# Patient Record
Sex: Female | Born: 1960 | Race: White | Hispanic: No | Smoking: Never smoker
Health system: Southern US, Community
[De-identification: ages and names within clinical notes are randomized; demographics above are authoritative.]

## PROBLEM LIST (undated history)

## (undated) DIAGNOSIS — E559 Vitamin D deficiency, unspecified: Secondary | ICD-10-CM

## (undated) DIAGNOSIS — R7303 Prediabetes: Secondary | ICD-10-CM

## (undated) DIAGNOSIS — E785 Hyperlipidemia, unspecified: Secondary | ICD-10-CM

## (undated) DIAGNOSIS — M1990 Unspecified osteoarthritis, unspecified site: Secondary | ICD-10-CM

## (undated) HISTORY — PX: BREAST REDUCTION SURGERY: SHX8

## (undated) HISTORY — DX: Unspecified osteoarthritis, unspecified site: M19.90

## (undated) HISTORY — PX: LAPAROSCOPY: SHX197

## (undated) HISTORY — DX: Prediabetes: R73.03

## (undated) HISTORY — DX: Hyperlipidemia, unspecified: E78.5

## (undated) HISTORY — PX: ABLATION: SHX5711

## (undated) HISTORY — DX: Vitamin D deficiency, unspecified: E55.9

---

## 2003-07-14 HISTORY — PX: REDUCTION MAMMAPLASTY: SUR839

## 2004-04-21 ENCOUNTER — Other Ambulatory Visit: Admission: RE | Admit: 2004-04-21 | Discharge: 2004-04-21 | Payer: Self-pay | Admitting: Obstetrics and Gynecology

## 2004-05-26 ENCOUNTER — Ambulatory Visit: Payer: Self-pay | Admitting: Cardiology

## 2004-07-22 ENCOUNTER — Ambulatory Visit (HOSPITAL_COMMUNITY): Admission: RE | Admit: 2004-07-22 | Discharge: 2004-07-22 | Payer: Self-pay | Admitting: Gastroenterology

## 2004-07-22 ENCOUNTER — Encounter (INDEPENDENT_AMBULATORY_CARE_PROVIDER_SITE_OTHER): Payer: Self-pay | Admitting: *Deleted

## 2004-10-01 ENCOUNTER — Ambulatory Visit: Payer: Self-pay | Admitting: Cardiology

## 2005-02-10 HISTORY — PX: SALPINGECTOMY: SHX328

## 2005-02-20 ENCOUNTER — Encounter (INDEPENDENT_AMBULATORY_CARE_PROVIDER_SITE_OTHER): Payer: Self-pay | Admitting: Specialist

## 2005-02-20 ENCOUNTER — Observation Stay (HOSPITAL_COMMUNITY): Admission: RE | Admit: 2005-02-20 | Discharge: 2005-02-21 | Payer: Self-pay | Admitting: Obstetrics and Gynecology

## 2005-02-23 ENCOUNTER — Ambulatory Visit: Payer: Self-pay | Admitting: Hematology & Oncology

## 2005-07-29 ENCOUNTER — Other Ambulatory Visit: Admission: RE | Admit: 2005-07-29 | Discharge: 2005-07-29 | Payer: Self-pay | Admitting: Obstetrics & Gynecology

## 2005-12-10 ENCOUNTER — Ambulatory Visit: Payer: Self-pay | Admitting: Cardiology

## 2006-01-18 ENCOUNTER — Ambulatory Visit: Payer: Self-pay | Admitting: Cardiology

## 2006-10-12 HISTORY — PX: PARTIAL KNEE ARTHROPLASTY: SHX2174

## 2006-11-05 ENCOUNTER — Other Ambulatory Visit: Admission: RE | Admit: 2006-11-05 | Discharge: 2006-11-05 | Payer: Self-pay | Admitting: Obstetrics & Gynecology

## 2006-11-10 ENCOUNTER — Encounter: Admission: RE | Admit: 2006-11-10 | Discharge: 2006-11-10 | Payer: Self-pay | Admitting: Obstetrics & Gynecology

## 2006-11-15 ENCOUNTER — Encounter: Admission: RE | Admit: 2006-11-15 | Discharge: 2006-11-15 | Payer: Self-pay | Admitting: Obstetrics & Gynecology

## 2007-03-02 ENCOUNTER — Ambulatory Visit: Payer: Self-pay | Admitting: Cardiology

## 2007-03-17 ENCOUNTER — Ambulatory Visit: Payer: Self-pay | Admitting: Cardiology

## 2007-03-17 LAB — CONVERTED CEMR LAB
ALT: 16 units/L (ref 0–35)
AST: 17 units/L (ref 0–37)
Albumin: 3.7 g/dL (ref 3.5–5.2)
Alkaline Phosphatase: 60 units/L (ref 39–117)
Bilirubin, Direct: 0.1 mg/dL (ref 0.0–0.3)
CRP, High Sensitivity: 4 (ref 0.00–5.00)
Direct LDL: 143 mg/dL
Total Bilirubin: 0.6 mg/dL (ref 0.3–1.2)
Total CHOL/HDL Ratio: 3.2
Total Protein: 6.7 g/dL (ref 6.0–8.3)
Triglycerides: 99 mg/dL (ref 0–149)
VLDL: 20 mg/dL (ref 0–40)

## 2007-12-15 ENCOUNTER — Encounter: Admission: RE | Admit: 2007-12-15 | Discharge: 2007-12-15 | Payer: Self-pay | Admitting: Obstetrics & Gynecology

## 2008-02-22 ENCOUNTER — Other Ambulatory Visit: Admission: RE | Admit: 2008-02-22 | Discharge: 2008-02-22 | Payer: Self-pay | Admitting: Obstetrics & Gynecology

## 2008-07-13 HISTORY — PX: REPLACEMENT TOTAL KNEE: SUR1224

## 2008-07-17 ENCOUNTER — Ambulatory Visit (HOSPITAL_COMMUNITY): Admission: RE | Admit: 2008-07-17 | Discharge: 2008-07-18 | Payer: Self-pay | Admitting: Orthopedic Surgery

## 2009-01-15 ENCOUNTER — Encounter: Admission: RE | Admit: 2009-01-15 | Discharge: 2009-01-15 | Payer: Self-pay | Admitting: Obstetrics & Gynecology

## 2009-08-09 ENCOUNTER — Ambulatory Visit: Payer: Self-pay | Admitting: Radiology

## 2009-08-09 ENCOUNTER — Emergency Department (HOSPITAL_BASED_OUTPATIENT_CLINIC_OR_DEPARTMENT_OTHER): Admission: EM | Admit: 2009-08-09 | Discharge: 2009-08-09 | Payer: Self-pay | Admitting: Emergency Medicine

## 2010-01-22 ENCOUNTER — Encounter: Admission: RE | Admit: 2010-01-22 | Discharge: 2010-01-22 | Payer: Self-pay | Admitting: Obstetrics & Gynecology

## 2010-08-03 ENCOUNTER — Encounter: Payer: Self-pay | Admitting: Obstetrics & Gynecology

## 2010-10-27 LAB — CBC
HCT: 33.4 % — ABNORMAL LOW (ref 36.0–46.0)
Hemoglobin: 11 g/dL — ABNORMAL LOW (ref 12.0–15.0)
RBC: 3.62 MIL/uL — ABNORMAL LOW (ref 3.87–5.11)
RDW: 14.1 % (ref 11.5–15.5)
WBC: 8 10*3/uL (ref 4.0–10.5)

## 2010-10-27 LAB — BASIC METABOLIC PANEL
BUN: 8 mg/dL (ref 6–23)
Calcium: 8 mg/dL — ABNORMAL LOW (ref 8.4–10.5)
GFR calc non Af Amer: >60 mL/min (ref >60)
Glucose, Bld: 86 mg/dL (ref 70–99)

## 2010-11-25 NOTE — Consult Note (Signed)
NAMEJESSILYNN, [name]              ACCOUNT NO.:  [id number]   MEDICAL RECORD NO.:  [id number]          PATIENT TYPE:  INP   LOCATION:                               FACILITY:  [facility]   PHYSICIAN:  Madlyn Frankel. Charlann Boxer, M.D.  DATE OF BIRTH:  08/14/1960   DATE OF CONSULTATION:  DATE OF DISCHARGE:                                 CONSULTATION   PROCEDURE:  Left partial knee replacement.   CHIEF COMPLAINT:  Left knee pain.   HISTORY OF PRESENT ILLNESS:  A 50 year old female with a history of left  medial knee pain secondary to medial compartment osteoarthritis.  It has  been refractory to all conservative treatment including oral anti-  inflammatories, cortisone injection and viscous supplementation.   PAST MEDICAL HISTORY:  Significant for osteoarthritis.   PAST SURGICAL HISTORY:  1. C-section x2.  2. Breast reduction.  3. Endometrial ablation.   FAMILY HISTORY:  Stroke, cardiovascular disease.   SOCIAL HISTORY:  Married.  Not a smoker.  Primary caregiver will be her  husband in the home postoperatively.   MEDICATION ALLERGIES:  CODEINE.   MEDICATIONS:  Wellbutrin 300 mg p.o. daily.   REVIEW OF SYSTEMS:  See HPI.   PHYSICAL EXAMINATION:  VITAL SIGNS:  Pulse 72, respirations 16, blood  pressure 130/78.  GENERAL:  Awake, alert and oriented.  NECK:  Supple.  No carotid bruits.  CHEST:  Lungs clear to auscultation bilaterally.  BREASTS:  Deferred.  HEART:  Regular rate and rhythm.  S1, S2 distinct.  ABDOMEN:  Soft, nondistended.  Bowel sounds present.  PELVIS:  Stable.  GU:  Deferred.  EXTREMITIES:  Left knee has the medial side pain.  SKIN:  No cellulitis.  NEUROLOGICAL:  Intact distal sensibilities.   LABORATORY DATA:  Labs, EKG, chest x-ray all pending presurgical  testing.   IMPRESSION:  Left knee medial compartment osteoarthritis.   PLAN OF ACTION:  Left unicondylar knee replacement of the medial  compartment by Dr. Madlyn Frankel. Charlann Boxer at [facility] on July 17, 2008.  Risks and complications were discussed.   Postoperative medications were provided and they included aspirin,  Robaxin, iron, Colace, Ambien.  She will be utilizing _______ for pain  medication as she has severe codeine allergy.     ______________________________  Yetta Glassman [address], [place]      Madlyn Frankel. Charlann Boxer, M.D.  Electronically Signed    BLM/MEDQ  D:  07/12/2008  T:  07/12/2008  Job:  [id number]   cc:   M. Leda Quail, MD  Fax: [phone]   Tammy R. Collins [place], M.D.  Fax: [phone]

## 2010-11-25 NOTE — Op Note (Signed)
[name], [name]              ACCOUNT NO.:  [id number]   MEDICAL RECORD NO.:  [id number]          PATIENT TYPE:  INP   LOCATION:  0008                         FACILITY:  [facility]   PHYSICIAN:  Madlyn Frankel. Charlann Boxer, M.D.  DATE OF BIRTH:  [birth year]   DATE OF PROCEDURE:  07/17/2008  DATE OF DISCHARGE:                               OPERATIVE REPORT   PREOPERATIVE DIAGNOSIS:  Left medial compartment osteoarthritis.   POSTOPERATIVE DIAGNOSIS:  Left medial compartment osteoarthritis.   PROCEDURE:  Left partial knee replacement.   COMPONENTS USED:  Biomet Oxford unicompartmental knee replacement, size  small femur with size a medial left tibia and a 4-mm insert  corresponding to the size small femur.   SURGEON:  Madlyn Frankel. Charlann Boxer, M.D.   ASSISTANT:  Dwyane Luo, P. A.   ANESTHESIA:  Dermal spinal.   DRAINS:  One Hemovac.   COMPLICATIONS:  None.   FINDINGS:  None.   TOURNIQUET TIME:  55 at 250 mmHg.   INDICATIONS FOR THE PROCEDURE:  Kanchan is a 50 year old patient of mine  who has had previous arthroscopic surgery and meniscal pathology.  She  had progressive arthritic changes in the postoperative period.  She had  failed bracing, medications and injection.  We reviewed options at 50  years of age and she was a good candidate for partial knee replacement  based on the fact that her pain was predominantly medial.  We reviewed  the risks and benefits of this type procedure with the benefits  obviously being pain relief and resuming activities without need of  medication with complications including infection, DVT, failure,  progression of arthritis and need for revision to total knee  replacement.  Consent was obtained for the benefit of pain relief.   PROCEDURE IN DETAIL:  The patient was brought to the operative theater.  Once adequate anesthesia, preoperative antibiotics and Ancef  administered, the patient was positioned supine and a thigh tourniquet  was placed.  The left lower  extremity was pre-scrubbed and prepped and  draped in a sterile fashion.  A time-out was performed to identify the  patient and extremity.  The leg was exsanguinated and tourniquet  elevated to 150 mmHg.  A paramidline incision was made from the patella  to the tubercle.  Sharp dissection was carried down to the extensor  mechanism identifying and creating soft tissue planes.  Arthrotomy was  created medially.  A large effusion was noted and drained.  After  initial synovectomy and debridement in this area and placement of  retractors, I removed osteophytes along the entire medial aspect of the  distal femur as well as the anterior.  I also removed a small notch of  osteophytes from the lateral aspect of the medial femoral condyle.  With  this exposure obtained at this point, the extramedullary guide was  placed on the proximal tibia and a perpendicular cut was made.  I  initially pinned it into position with a reciprocating saw used first  and then an oscillating saw. I checked the cut surface and felt that a  size 8 tibia fit best.  With the 8 tibia in place, I found it was  difficult to get the size 4 feeler gauge in place.  I  thus replaced the  extramedullary guide and recut the tibia 3 more mm.   With this, I was able to get the 4-mm feeler gauge in and did not need  to move up to a size 5.   At this point,the femoral canal was opened with a drill and increased in  size by the starting awl.  I then placed an intramedullary rod.   A size 3 feeler gauge was placed followed by the guide for the posterior  cutting block.  Once it was positioned in the middle portion of the  medial femoral condyle as well as parallel to the rod and lateral  planes, the drill holes were made.   This was exchanged for the posterior cutting block.  The oscillating saw  was used to make a posterior cut.   At this point, I was able to maneuver the meniscus.  The remaining bone  were all inspected in the  posterior medial aspect of the knee. Once this  was done, I checked with a 8 tibial tray and the small femur and was  able to feel the gauge in place; however, I was unable to get even one  in comfortably in 20 degrees of flexion.  For this reason, I chose to  use a size 5 feeler gauge and a size 5 spigot was placed and the distal  femur was milled.   I removed remaining bone and we trialed.  At this point. I was happy  with a size 4, both in flexion and extension.  We removed the trial  components.  The tibial tray was held in position and pinned and I used  the reciprocating saw to create the trough for the keel.  I then removed  remaining bone using the keeled osteotome as well as to the pterion  rongeur.  A trial reduction was carried out with the keeled 8 medial  left tibia and small femur.  I used the lollipop inserts to identify;  the ligaments appeared to be stable from extension to flexion.  There  was no evidence of impingement.   Given all these parameters, I removed the trial components. I drilled  sclerotic bone with a drill  available in the set.  We then irrigated  the injected the knee and injected the synovial capsule junction with a  course of Marcaine with epinephrine and 1 mL of Toradol.   Cement was mixed.  Final components were opened.  Once I irrigated the  knee and removed debris and trialed this cut surface,  the tibial  component was cemented in first.  Compression was applied to the trial  femoral component in 45 degrees of flexion.  Extruded cement was  removed.  After 2 minutes, the femoral component was cemented in  position.  Extruded cement was removed and the knee brought to 45  degrees of flexion and again held with the 4 feeler gauge to apply  compression.  Once cement cured, excess cement was removed throughout  the knee.  Once I was satisfied, I was unable to visualize any remaining  cement.  We retrialed the lollipop size 4 insert.  The knee  again  appeared to be very well-balanced.  I used an osteotome to remove some  the anterior bone off the mid surface of the distal femur.   The final 4 insert  was opened and it popped into position with good  tension.   At this point, we reirrigated the knee and the tourniquet was let down  at 55 minutes.  A medium Hemovac drain was placed deep.  With the knee  in flexion, I reapproximated the extensor mechanism using #1 Vicryl.  The remainder of  the wound was closed with 2-0 Vicryl and running 4-0 Monocryl.  The knee  was cleaned, dried and dressed sterilely with Steri-Strips and a bulky  wrap.  She was brought to recovery room in stable condition tolerating  the procedure well.      Madlyn Frankel Charlann Boxer, M.D.  Electronically Signed     MDO/MEDQ  D:  07/17/2008  T:  07/17/2008  Job:  [id number]

## 2010-11-28 NOTE — Op Note (Signed)
[name], [name]              ACCOUNT NO.:  [id number]   MEDICAL RECORD NO.:  [id number]          PATIENT TYPE:  OBV   LOCATION:  9305                          FACILITY:  [facility]   PHYSICIAN:  Michelle L. Grewal, M.D.DATE OF BIRTH:  20-Aug-1960   DATE OF PROCEDURE:  02/20/2005  DATE OF DISCHARGE:                                 OPERATIVE REPORT   PREOPERATIVE DIAGNOSES:  1.  Pelvic pain.  2.  Menometorrhagia.  3.  Anemia.  4.  Adnexal mass.   POSTOPERATIVE DIAGNOSES:  1.  Left tubal endometriosis.  2.  Tubal torsion.  3.  Menometorrhagia.   PROCEDURE:  1.  Diagnostic laparoscopy.  2.  Lysis of adhesions.  3.  Left salpingectomy.  4.  Right tubal ligation.  5.  D&C.  6.  Hysteroscopy.  7.  ThermaChoice endometrial ablation.   SURGEON:  Michelle L. Vincente Poli, M.D.   ANESTHESIA:  General.   SPECIMENS:  Uterine curetting's and left tube.   ESTIMATED BLOOD LOSS:  300 mL.   COMPLICATIONS:  None.   DESCRIPTION OF PROCEDURE:  The patient taken to the operating room, she was  intubated without difficulty. She was prepped and draped in the usual  sterile fashion. An in and out catheter was used to empty the bladder. A  uterine manipulator was inserted and attention was turned to the abdomen. An  infraumbilical incision was made and the Veress needle is inserted one time.  Pneumoperitoneum is achieved. The 11 mm trocar is inserted and the patient  placed in Trendelenburg position. I then placed two accessory ports, an 11  mm suprapubic and 11 mm in the right lower quadrant under direct  visualization. We then visualized the upper abdomen which appeared to be  normal. The uterus appeared to be normal on the exterior surface. The right  tube and ovary were within normal limits. The pathology was on the left side  of the pelvis where there was endometriosis involving the left pelvic  sidewall and the left fallopian tube was torsed many times with a large  endometrioma at the very  end of it which was fixed to the cul-de-sac. I then  did some blunt and sharp dissection lysing adhesions, freed up the ovary  pretty good on the left side, lifted up the tube and transected it using the  gyrus. I then went over with Kleppinger's and did a right tubal ligation as  the patient desired permanent sterilization. At this point, the patient is  sterile. After I resected the tube, I then switched to a 5 mm scope, put the  Endobag through the infraumbilical port, put the mass through the Endobag  and removed it completely. Irrigation of the pelvis was performed, excellent  hemostasis was again noted. We then released the pneumoperitoneum, removed  all trocars and closed each incision with Dermabond. At this point, I turned  vaginally where a speculum was inserted into the vagina, the cervix was  grasped with a tenaculum, cervical internal os was gently dilated using  Pratt dilators, diagnostic hysteroscope was inserted through the uterine  cavity. There was a copious  amount of clot in there and with irrigation and  with sharp curette, I removed all tissue in the uterus. I think that had  distended her cavity quite a bit. At that point, we then placed the  ThermaChoice three balloon inside and performed an endometrial ablation  according to [facility] specifications. During this procedure, she was  noted to be oozing quite a bit until the very end of the procedure she  stopped bleeding and then was given Methergine. She was observed during this  time and noted to have minimal bleeding. All instruments were removed from  the vagina, all sponge, lap and instrument counts were correct x2. The  patient went to the recovery room in stable condition. Because of the  patient's low hemoglobin, I recommended for her to stay overnight for  observation.       MLG/MEDQ  D:  02/21/2005  T:  02/21/2005  Job:  [id number]

## 2010-11-28 NOTE — Discharge Summary (Signed)
[name], [name]              ACCOUNT NO.:  [id number]   MEDICAL RECORD NO.:  [id number]          PATIENT TYPE:  OIB   LOCATION:  1611                         [facility]ACILITY:  [facility]   PHYSICIAN:  Madlyn Frankel. Charlann Boxer, M.D.  DATE OF BIRTH:  1961/04/06   DATE OF ADMISSION:  07/17/2008  DATE OF DISCHARGE:  07/18/2008                               DISCHARGE SUMMARY   ADMITTING DIAGNOSIS:  Osteoarthritis.   DISCHARGE DIAGNOSIS:  Osteoarthritis.   HISTORY OF PRESENT ILLNESS:  A 50 year old female with a history of left  medial knee pain secondary to medial compartment osteoarthritis.  Refractory to all conservative treatment.   PROCEDURE:  Left partial knee replacement, medial compartment.  Surgeon  Dr. Durene Romans.  Assistant Dwyane Luo PA-C.   CONSULTATION:  None.   LABORATORY DATA:  CBC final reading showed her white blood cell count of  8, hemoglobin 11, hematocrit 33.4, platelets 239.  Coags normal.  White  cell differential within normal limits.  Metabolic panel sodium 140,  potassium 3.8, glucose 86, creatinine 0.61.  Calcium 8.  UA did show a  hemoglobin rare bacteria, negative for nitrites.  Cardiology EKG shows sinus rhythm.   HOSPITAL COURSE:  The patient underwent a left partial knee replacement  of medial compartment.  Stay was unremarkable.  She made great progress  with physical therapy.  She was hemodynamically stable.  Dressing  changes demonstrated to the patient.  Home health care with PT was  arranged.   DISCHARGE DISPOSITION:  Discharged home with home health care PT, stable  and improved condition.   DISCHARGE PHYSICAL THERAPY:  Weightbearing as tolerated with rolling  walker.   DISCHARGE DIET:  Heart-healthy.   DISCHARGE WOUND CARE:  Keep dry.   DISCHARGE MEDICATIONS:  1. Aspirin 325 mg p.o. b.i.d. times 6 weeks.  2. Robaxin 500 mg p.o. q.6.  3. Iron 325 mg p.o. t.i.d.  4. Colace 100 mg p.o. b.i.d.  5. MiraLax 17 grams p.o. daily.  6. Celebrex 200 mg p.o.  b.i.d.  7. Wellbutrin 300 mg 1 p.o. q.a.m.  8. Nucynta 50 mg 1-2 p.o. q.4-6 p.r.n. pain.   DISCHARGE FOLLOWUP:  Follow with Dr. Charlann Boxer at phone number [phone] in 2  weeks for wound check.     ______________________________  [name], [place]      Madlyn Frankel. Charlann Boxer, M.D.  Electronically Signed    BLM/MEDQ  D:  08/07/2008  T:  08/07/2008  Job:  [id number]   cc:   M. Leda Quail, MD  Fax: [phone]   Tammy R. Collins [place], M.D.  Fax: [phone]

## 2010-11-28 NOTE — Letter (Signed)
October 13, 2007    To Whom It May Concern:   RE:  JERMIYA, REICHL  MRN:  [id number]  /  DOB:  Jan 10, 1961   I have been informed of the fact that insurance will not pay for two  [employer] lipoprotein profiles that were done on Ms. [name].  Because of such discrepant results between two different panels, I  ordered an additional study.  I was concerned that one of these studies  might have a been lab error.   Because of a strong family history of vascular disease, a high level of  lipoprotein A, it was very important to clarify this as much as  possible.  Treatment with a statin and/or other medication depended on  these results.  I also discussed this with Dr. Hulda Marin at [facility], a lipid specialist, and he agreed that this  needed to be rectified.   Hence, we felt this was medically necessary for determining treatment  for this patient.  Thus, we also feel that it should be paid for by her  insurance.    Sincerely,      Jesse Sans. Daleen Squibb, MD, [facility]  Electronically Signed    TCW/MedQ  DD: 10/13/2007  DT: 10/14/2007  Job #: [id number]   CC:    [name]

## 2010-11-28 NOTE — Assessment & Plan Note (Signed)
[facility]                            CARDIOLOGY OFFICE NOTE   [name], [name]                     MRN:          [id number]  DATE:03/31/2007                            DOB:          [birth year]    [name] returns today for further management of her hyperlipidemia  and elevated LP(a).   She has a markedly positive family history of ischemic heart disease  with her mother, who also has an elevated LP(a).   We started her initially on Vytorin 10/20.  We had an excellent response  as noted in the chart.   We repeated lipids in August, which showed a regression in her values.  We were concerned that she was not taking the drug.  We repeated a  standard profile, which confirmed the fact that her lipids had increased  to baseline.  I brought her in today for conversation.   She tells me now that she had stopped the drug when her pharmaceutical  plan messed her up, and she had read all the mess about Vytorin.   It also should be noted that her C reactor protein was high as well.   She is totally asymptomatic at the present time.  She is recovering from  left knee arthroscopy, but is getting back in to tennis.  She only takes  Wellbutrin 150 daily.   I have had a conversation with her today, and showed her her values.  I  made a copy of her most recent values.  Since she has concern about  Vytorin, and the goal is to get her LDL well below 100, we will change  her to Lipitor 20 mg p.o. daily.  We will put her in for standard lipid  panel and LFTs in 6 weeks.  I am going to place a call to Dr. Hulda Marin at [facility] to see if there is anything else he would  offer.  I will plan on seeing her back again in 6 months.     Thomas C. Daleen Squibb, MD, [facility]  Electronically Signed    TCW/MedQ  DD: 03/31/2007  DT: 03/31/2007  Job #: [id number]   cc:   [name], M.D.

## 2010-12-30 ENCOUNTER — Other Ambulatory Visit: Payer: Self-pay | Admitting: Obstetrics & Gynecology

## 2010-12-30 DIAGNOSIS — Z1231 Encounter for screening mammogram for malignant neoplasm of breast: Secondary | ICD-10-CM

## 2011-01-26 ENCOUNTER — Ambulatory Visit
Admission: RE | Admit: 2011-01-26 | Discharge: 2011-01-26 | Disposition: A | Discharge status: Home / Self Care | Payer: Commercial Managed Care - PPO | Source: Ambulatory Visit | Attending: Obstetrics & Gynecology | Admitting: Obstetrics & Gynecology

## 2011-01-26 DIAGNOSIS — Z1231 Encounter for screening mammogram for malignant neoplasm of breast: Secondary | ICD-10-CM

## 2011-02-17 ENCOUNTER — Telehealth: Payer: Self-pay | Admitting: Cardiovascular Disease

## 2011-02-17 NOTE — Telephone Encounter (Signed)
Last Lipid panel was in 2008. Left a message informing patient to fast the morning she sees Dr. Clifton James and we could recheck fasting labs.

## 2011-02-17 NOTE — Telephone Encounter (Signed)
Per pt call, pt set up an appt w/ [facility] for Aug 29. Pt last saw Dr. Daleen Squibb in 2009. Pt wants to know if she needs to have blood work done prior to pt appt. Please return pt call to advise/disucss.

## 2011-03-11 ENCOUNTER — Encounter: Payer: Commercial Managed Care - PPO | Admitting: Cardiovascular Disease

## 2011-04-03 ENCOUNTER — Encounter: Payer: Self-pay | Admitting: Cardiovascular Disease

## 2011-04-06 ENCOUNTER — Encounter: Payer: Self-pay | Admitting: Cardiovascular Disease

## 2011-04-06 ENCOUNTER — Ambulatory Visit (INDEPENDENT_AMBULATORY_CARE_PROVIDER_SITE_OTHER): Payer: Commercial Managed Care - PPO | Admitting: Cardiovascular Disease

## 2011-04-06 VITALS — BP 140/80 | HR 60 | Resp 18 | Ht 66.0 in | Wt 198.8 lb

## 2011-04-06 DIAGNOSIS — E785 Hyperlipidemia, unspecified: Secondary | ICD-10-CM | POA: Insufficient documentation

## 2011-04-06 LAB — LIPID PANEL
HDL: 70.9 mg/dL (ref >39.00)
Total CHOL/HDL Ratio: 3
Triglycerides: 92 mg/dL (ref 0.0–149.0)
VLDL: 18.4 mg/dL (ref 0.0–40.0)

## 2011-04-06 LAB — LDL CHOLESTEROL, DIRECT: Direct LDL: 148 mg/dL

## 2011-04-06 NOTE — Progress Notes (Signed)
History of Present Illness:50 yo female with h/o hyperlipidemia (Lp (a)) here today as a new pt. She had been seen remotely by Dr. Juanito Doom in September 2008. She is here today to re-establish cardiology care. She has a strong family history of CAD. She tells me that she feels well. No chest pain, SOB, palpitations. She feels well overall.    Past Medical History  Diagnosis Date  . Hyperlipidemia   . Osteoarthritis     Past Surgical History  Procedure Date  . Partial knee arthroplasty     No current outpatient prescriptions on file.    Allergies  Allergen Reactions  . Codeine     History   Social History  . Marital Status: Married    Spouse Name: N/A    Number of Children: N/A  . Years of Education: N/A   Occupational History  . Not on file.   Social History Main Topics  . Smoking status: Not on file  . Smokeless tobacco: Not on file  . Alcohol Use: Not on file  . Drug Use: Not on file  . Sexually Active: Not on file   Other Topics Concern  . Not on file   Social History Narrative  . No narrative on file    Family History  Problem Relation Age of Onset  . Heart disease      Review of Systems:  As stated in the HPI and otherwise negative.   BP 140/80  Pulse 60  Resp 18  Ht 5\' 6"  (1.676 m)  Wt 198 lb 12.8 oz (90.175 kg)  BMI 32.09 kg/m2  Physical Examination: General: Well developed, well nourished, NAD HEENT: OP clear, mucus membranes moist SKIN: warm, dry. No rashes. Neuro: No focal deficits Musculoskeletal: Muscle strength 5/5 all ext Psychiatric: Mood and affect normal Neck: No JVD, no carotid bruits, no thyromegaly, no lymphadenopathy. Lungs:Clear bilaterally, no wheezes, rhonci, crackles Cardiovascular: Regular rate and rhythm. No murmurs, gallops or rubs. Abdomen:Soft. Bowel sounds present. Non-tender.  Extremities: No lower extremity edema. Pulses are 2 + in the bilateral DP/PT.  EKG:NSR, rate 60 bpm. Normal EKG.

## 2011-04-06 NOTE — Patient Instructions (Signed)
Your physician wants you to follow-up in:  12 months.  You will receive a reminder letter in the mail two months in advance. If you don't receive a letter, please call our office to schedule the follow-up appointment.   

## 2011-04-06 NOTE — Assessment & Plan Note (Signed)
Will check lipids. Call with results.

## 2011-04-10 ENCOUNTER — Telehealth: Payer: Self-pay | Admitting: Cardiovascular Disease

## 2011-04-10 NOTE — Telephone Encounter (Signed)
Pt returning you call

## 2011-04-10 NOTE — Telephone Encounter (Signed)
Patient return Dr. Gibson Ramp call regarding lab work results and starting Crestor 10 mg one tablet po at bedtime. Patient would like samples to see if she can tolerate medication A 14/20 mg sample pills placed at the front desk. She is aware to cut pill in 1/2 and take 1/2 tablet at bedtime.  Patient will call back to let MD  know whether she tolerate medication, and send a  prescription to the pharmacy . Also she will call  to schedule the 12 weeks lipid LFT blood work, because she would like to try the medication first and see if can tolerate it.

## 2011-04-17 LAB — BASIC METABOLIC PANEL
Calcium: 8.9 mg/dL (ref 8.4–10.5)
Chloride: 103 mEq/L (ref 96–112)
GFR calc Af Amer: >60 mL/min (ref >60)
Potassium: 3.7 mEq/L (ref 3.5–5.1)
Sodium: 139 mEq/L (ref 135–145)

## 2011-04-17 LAB — CBC
HCT: 41 % (ref 36.0–46.0)
Hemoglobin: 13.6 g/dL (ref 12.0–15.0)
MCV: 91.8 fL (ref 78.0–100.0)
Platelets: 269 10*3/uL (ref 150–400)

## 2011-04-17 LAB — URINALYSIS, ROUTINE W REFLEX MICROSCOPIC
Glucose, UA: NEGATIVE mg/dL
Ketones, ur: NEGATIVE mg/dL
Nitrite: NEGATIVE
Specific Gravity, Urine: 1.025 (ref 1.005–1.030)
pH: 6 (ref 5.0–8.0)

## 2011-04-17 LAB — URINE MICROSCOPIC-ADD ON

## 2011-04-17 LAB — DIFFERENTIAL
Eosinophils Absolute: 0.1 10*3/uL (ref 0.0–0.7)
Lymphocytes Relative: 22 % (ref 12–46)
Lymphs Abs: 1.1 10*3/uL (ref 0.7–4.0)
Monocytes Absolute: 0.4 10*3/uL (ref 0.1–1.0)
Monocytes Relative: 7 % (ref 3–12)
Neutro Abs: 3.7 10*3/uL (ref 1.7–7.7)
Neutrophils Relative %: 69 % (ref 43–77)

## 2011-04-17 LAB — PROTIME-INR: INR: 0.9 (ref 0.00–1.49)

## 2011-12-31 ENCOUNTER — Other Ambulatory Visit: Payer: Self-pay | Admitting: Obstetrics & Gynecology

## 2011-12-31 DIAGNOSIS — Z1231 Encounter for screening mammogram for malignant neoplasm of breast: Secondary | ICD-10-CM

## 2012-02-02 ENCOUNTER — Ambulatory Visit: Payer: Commercial Managed Care - PPO

## 2012-09-22 ENCOUNTER — Ambulatory Visit
Admission: RE | Admit: 2012-09-22 | Discharge: 2012-09-22 | Disposition: A | Discharge status: Home / Self Care | Payer: BC Managed Care – PPO | Source: Ambulatory Visit | Attending: Obstetrics & Gynecology | Admitting: Obstetrics & Gynecology

## 2012-09-22 DIAGNOSIS — Z1231 Encounter for screening mammogram for malignant neoplasm of breast: Secondary | ICD-10-CM

## 2013-01-05 ENCOUNTER — Encounter: Payer: Self-pay | Admitting: Certified Nurse Midwife

## 2013-01-10 ENCOUNTER — Ambulatory Visit (INDEPENDENT_AMBULATORY_CARE_PROVIDER_SITE_OTHER): Payer: BC Managed Care – PPO | Admitting: Certified Nurse Midwife

## 2013-01-10 ENCOUNTER — Encounter: Payer: Self-pay | Admitting: Certified Nurse Midwife

## 2013-01-10 VITALS — BP 108/62 | HR 64 | Resp 16 | Ht 65.75 in | Wt 202.0 lb

## 2013-01-10 DIAGNOSIS — F419 Anxiety disorder, unspecified: Secondary | ICD-10-CM

## 2013-01-10 DIAGNOSIS — N951 Menopausal and female climacteric states: Secondary | ICD-10-CM

## 2013-01-10 DIAGNOSIS — Z01419 Encounter for gynecological examination (general) (routine) without abnormal findings: Secondary | ICD-10-CM

## 2013-01-10 DIAGNOSIS — F411 Generalized anxiety disorder: Secondary | ICD-10-CM

## 2013-01-10 MED ORDER — ESCITALOPRAM OXALATE 10 MG PO TABS: 10.0000 mg | ORAL_TABLET | Freq: Every day | ORAL | Status: DC

## 2013-01-10 NOTE — Progress Notes (Signed)
52 y.o. G2P2002 Married Caucasian Fe here for annual exam. Periods normal, light until 12/13 then no menses until 6/14 with light  4 day duration. Having hot flashes, no problems. No vaginal dryness. Working hard with work, getting daughter who is OCD ready for college. Very anxious to the point of insomnia. Sees PCP prn. Had labs at work all normal per patient. Patient has been on medication for anxiety before and feels she is at the point she is beginning to feel out of control.  No thought of self harm or others.  Spouse supportive, but not helpful with family issues. Has counselor currently seeing on prn basis. No other health issues today.  Patient's last menstrual period was 12/15/2012.          Sexually active: no  The current method of family planning is abstinence.    Exercising: yes  treadmill, pump classes & walking Smoker:  no  Health Maintenance: Pap:  01-07-12 neg HPV HR neg MMG:  09-22-12 normal Colonoscopy:  2006 BMD:   none TDaP:  2006 Labs: none Self breast exam: not done   reports that she has never smoked. She does not have any smokeless tobacco history on file. She reports that she drinks about 1.0 ounces of alcohol per week. She reports that she does not use illicit drugs.  Past Medical History  Diagnosis Date  . Hyperlipidemia   . Osteoarthritis     Past Surgical History  Procedure Laterality Date  . Partial knee arthroplasty Left 4/08  . Laparoscopy    . Salpingectomy Left 8/06    secondary to cyst  . Ablation    . Ablation    . Breast reduction surgery    . Replacement total knee Left 1/10    Current Outpatient Prescriptions  Medication Sig Dispense Refill  . Acetaminophen (TYLENOL PO) Take by mouth as needed.      . Ibuprofen (ADVIL PO) Take by mouth as needed.      . Multiple Vitamins-Minerals (MULTIVITAMIN PO) Take by mouth as needed.       No current facility-administered medications for this visit.    Family History  Problem Relation Age of  Onset  . Heart disease    . Heart attack Mother   . Hypertension Mother   . Diabetes Sister     ROS:  Pertinent items are noted in HPI.  Otherwise, a comprehensive ROS was negative.  Exam:   BP 108/62  Pulse 64  Resp 16  Ht 5' 5.75" (1.67 m)  Wt 202 lb (91.627 kg)  BMI 32.85 kg/m2  LMP 12/15/2012 Height: 5' 5.75" (167 cm)  Ht Readings from Last 3 Encounters:  01/10/13 5' 5.75" (1.67 m)  04/06/11 5\' 6"  (1.676 m)    General appearance: alert, cooperative and appears stated age Head: Normocephalic, without obvious abnormality, atraumatic Neck: no adenopathy, supple, symmetrical, trachea midline and thyroid normal to inspection and palpation Lungs: clear to auscultation bilaterally Breasts: normal appearance, no masses or tenderness, No nipple retraction or dimpling, No nipple discharge or bleeding, No axillary or supraclavicular adenopathy Heart: regular rate and rhythm Abdomen: soft, non-tender; no masses,  no organomegaly Extremities: extremities normal, atraumatic, no cyanosis or edema Skin: Skin color, texture, turgor normal. No rashes or lesions Lymph nodes: Cervical, supraclavicular, and axillary nodes normal. No abnormal inguinal nodes palpated Neurologic: Grossly normal   Pelvic: External genitalia:  no lesions              Urethra:  normal appearing  urethra with no masses, tenderness or lesions              Bartholin's and Skene's: normal                 Vagina: normal appearing vagina with normal color and discharge, no lesions              Cervix: normal, non tender              Pap taken: no Bimanual Exam:  Uterus:  normal size, contour, position, consistency, mobility, non-tender and anteflexed              Adnexa: normal adnexa and no mass, fullness, tenderness               Rectovaginal: Confirms               Anus:  normal sphincter tone, no lesions  A:  Well Woman with normal exam  Perimenopausal with cycle change( no menses for 5 months then recent  normal period)  History of anxiety, feels medication needed again  Social stress with daughter with OCD   P:  Reviewed health and wellness pertinent to exam  Discussed perimenopausal changes,period expectations and also emotional changes. Also discussed thyroid effect on cycles. Given menses calendar with normal and abnormal parameters. Instructed to call if no menses in 3 months Lab: FSH,TSH  Discussed risks and benefits of anxiety medication, expectations and need for follow up with counselor and provider here for evaluation of medication effects. Patient agreeable to plan. Instructed to seek ER or 911 if thought of self harm or others. Encouraged to seek spouse support with family issues. Questions addressed RX Lexapro see order    Pap smear as per guidelines   Mammogram yearly pap smear not taken today  counseled on breast self exam, mammography screening, use and side effects of HRT, adequate intake of calcium and vitamin D, diet and exercise  Recheck medication use in 2 weeks   An After Visit Summary was printed and given to the patient.

## 2013-01-10 NOTE — Patient Instructions (Addendum)

## 2013-01-10 NOTE — Progress Notes (Signed)
Reviewed CPRomine, MD 

## 2013-01-11 ENCOUNTER — Telehealth: Payer: Self-pay

## 2013-01-11 NOTE — Telephone Encounter (Signed)
Message copied by Eliezer Bottom on Wed Jan 11, 2013 10:22 AM ------      Message from: Verner Chol      Created: Wed Jan 11, 2013  8:02 AM       Notify [facility] menopausal range, may or may not have bleeding, if no menses in 3 months as discussed needs to call      TSH normal ------

## 2013-01-11 NOTE — Telephone Encounter (Signed)
lmtcb

## 2013-01-11 NOTE — Telephone Encounter (Signed)
Patient notified of results.

## 2013-01-24 ENCOUNTER — Ambulatory Visit: Payer: BC Managed Care – PPO | Admitting: Certified Nurse Midwife

## 2013-01-25 ENCOUNTER — Ambulatory Visit: Payer: BC Managed Care – PPO | Admitting: Certified Nurse Midwife

## 2013-01-25 ENCOUNTER — Telehealth: Payer: Self-pay | Admitting: Certified Nurse Midwife

## 2013-01-25 DIAGNOSIS — F419 Anxiety disorder, unspecified: Secondary | ICD-10-CM

## 2013-01-25 NOTE — Telephone Encounter (Signed)
Patient called in to say that she would make her appointment . She went out to come in and her car would not start. She was waiting on [employer]. DNKA

## 2013-01-30 ENCOUNTER — Encounter: Payer: Self-pay | Admitting: Certified Nurse Midwife

## 2013-01-30 ENCOUNTER — Ambulatory Visit (INDEPENDENT_AMBULATORY_CARE_PROVIDER_SITE_OTHER): Payer: BC Managed Care – PPO | Admitting: Certified Nurse Midwife

## 2013-01-30 VITALS — BP 130/80 | HR 66 | Resp 18 | Wt 209.2 lb

## 2013-01-30 DIAGNOSIS — F411 Generalized anxiety disorder: Secondary | ICD-10-CM

## 2013-01-31 NOTE — Progress Notes (Signed)
52 y.o.MarriedCaucasianfemaleG2P2002here for follow-up of anxiety disorder being treated with  Lexapro   Initiated July 1,2014. Patient taking medication as instructed PM.  Denies nausea, headache or other medication side effects. Reports crying no, panic attacks no, insomnia no, fatigue no, thoughts of self harm or others no.  Feelings of anxiety have decreased significantly. Sleeping better now, with only periods of insomnia. "Feel more in control now". Seeing her previous counselor prn.  Eating better and interactions with family are much better.  Occasional hot flashes, and night sweats, but not interfering with sleep.         O:Healthy WD,WN female, appropriately dressed    : Affect : Appropriate and dressed appropriately    A:1-Anxiety responding to Lexapro 2-Counseling in progress prn  P:1- Continue medication as prescribed 2-RX Lexapro see order 3-RV 9/14 or prn 4-Instructed if thoughts of self harm or others seek immediate help 911 or emergency room.  Questions addressed.       25 minutes spent with patient with >50% of time spent in face to face counseling.               Reviewed, TL

## 2013-02-10 NOTE — Telephone Encounter (Signed)
Pt was seen on 01/30/13 in follow up, but Lexapro was not sent to pharmacy.  Please advise refills.  Thanks.

## 2013-02-10 NOTE — Telephone Encounter (Signed)
lexapro refill request , [facility] . Patient needs refill today will be out of medication this weekend.

## 2013-02-13 ENCOUNTER — Other Ambulatory Visit: Payer: Self-pay | Admitting: Certified Nurse Midwife

## 2013-02-13 NOTE — Telephone Encounter (Signed)
eScribe request for refill on LEXAPRO Last filled - 01-10-13, #30 X 0 REFILLS Last AEX - 01/30/13 Next AEX - 01/11/14 No refills sent at AEX, please advise refills.

## 2013-02-13 NOTE — Telephone Encounter (Signed)
Per review of chart order was sent but looking at it , it shows reorder completed. Needs call to pharmacy to see status, cut should have been a refill

## 2013-02-13 NOTE — Telephone Encounter (Signed)
RX sent in 02/13/13 refill encounter.

## 2013-04-11 ENCOUNTER — Ambulatory Visit (INDEPENDENT_AMBULATORY_CARE_PROVIDER_SITE_OTHER): Payer: BC Managed Care – PPO | Admitting: Certified Nurse Midwife

## 2013-04-11 ENCOUNTER — Telehealth: Payer: Self-pay | Admitting: Certified Nurse Midwife

## 2013-04-11 VITALS — BP 118/80 | HR 72 | Resp 16 | Ht 65.75 in | Wt 209.0 lb

## 2013-04-11 DIAGNOSIS — F411 Generalized anxiety disorder: Secondary | ICD-10-CM

## 2013-04-11 MED ORDER — ESCITALOPRAM OXALATE 10 MG PO TABS: 10.0000 mg | ORAL_TABLET | Freq: Every day | ORAL | Status: DC

## 2013-04-11 NOTE — Telephone Encounter (Signed)
Per Debbi, chart reviewed and follow-up on Lexapro still recommended.  If doing ok today, will discuss extending RX.  Patient states she will keep appt.

## 2013-04-11 NOTE — Progress Notes (Signed)
Note reviewed, agree with plan.  Nehan Flaum, MD  

## 2013-04-11 NOTE — Telephone Encounter (Signed)
Call to patient who states she has been out of town and has a lot to do at work.  Realized that she thinks this appointment today is just to discuss amenorrhea and patient states she has had a period in August.  She also reports she is doing fine on Lexapro and does not think she needs follow up for this.  Wants to know if Debbi feels today's appointment is needed?

## 2013-04-11 NOTE — Progress Notes (Signed)
52 y.o.Married Caucasian female G2P2002 here for follow-up of anxiety disorder being treated with  Lexapro 10 mg   Initiated July1, 2014. Patient taking medication as instructed PM.  Denies nausea, headache or other medication side effects. Reports no crying,  panic attacks,  Insomnia, fatigue, or  thoughts of self harm or others .  Feelings of anxiety have decreased and "I feel so much better". Recently friends' spouse died from pancreatic cancer and "I would normally have a hard time handling this, but did well". Daughter now settled at college and son busy with sports. No issues today, except "do not want to stop Lexapro,"!  Patient had normal period 03/08/13 and aware of need to advise if no period for 3 months.      Seeing  Counselor prn.    O:Healthy WD,WN female, appropriately dressed    Weight:209 Affect : Appropriate and orientation x 3    A:1-Anxiety responding to Lexapro  2-Counseling as needed 3-Perimenopausal  P:1- Continue medication as prescribed 2-RX Lexapro 10 mg  See orders 3-RV 07/2013 per phone and prn for follow up 4-Instructed if thoughts of self harm or others seek immediate help 911 or emergency room.  Questions addressed.                     20 minutes spent with patient with >50% of time spent in face to face counseling.

## 2013-04-11 NOTE — Telephone Encounter (Signed)
Patient has an appt this morning at 10:30 she isnt sure if she really needs to come in for the appt. It was supposed to be for a f/u about having a period since she is in mentopause and she did start one august 27 and says this appt was to give her something if she hadnt had one.

## 2013-05-18 ENCOUNTER — Other Ambulatory Visit: Payer: Self-pay

## 2013-06-01 ENCOUNTER — Telehealth: Payer: Self-pay | Admitting: Certified Nurse Midwife

## 2013-06-01 NOTE — Telephone Encounter (Signed)
04/11/13 #30/4 Refills was sent.  S/w pharmacist Tresa Endo at [facility] they do have rx and will get it ready for the patient, patient notified.

## 2013-06-01 NOTE — Telephone Encounter (Signed)
Patient calling for refills of Lexapro please? She thought we had sent a new RX to her pharmacy at her last visit but they do not have it? [facility] [address]

## 2013-08-22 ENCOUNTER — Other Ambulatory Visit: Payer: Self-pay

## 2013-08-22 DIAGNOSIS — Z1231 Encounter for screening mammogram for malignant neoplasm of breast: Secondary | ICD-10-CM

## 2013-10-03 ENCOUNTER — Ambulatory Visit
Admission: RE | Admit: 2013-10-03 | Discharge: 2013-10-03 | Disposition: A | Discharge status: Home / Self Care | Payer: Self-pay | Source: Ambulatory Visit

## 2013-10-03 DIAGNOSIS — Z1231 Encounter for screening mammogram for malignant neoplasm of breast: Secondary | ICD-10-CM

## 2013-11-08 ENCOUNTER — Other Ambulatory Visit: Payer: Self-pay | Admitting: Certified Nurse Midwife

## 2013-11-08 NOTE — Telephone Encounter (Signed)
eScribe request from [facility] for refill on LEXAPRO Last filled - 04/11/13, #30 X 4, pt did not need to use this RX until 06/01/13. Last AEX - 01/10/13 Next AEX - 01/11/14 Last office visit for this medication 04/11/13. Please advise refillls.

## 2014-01-11 ENCOUNTER — Ambulatory Visit: Payer: BC Managed Care – PPO | Admitting: Certified Nurse Midwife

## 2014-02-01 ENCOUNTER — Ambulatory Visit (INDEPENDENT_AMBULATORY_CARE_PROVIDER_SITE_OTHER): Payer: BC Managed Care – PPO | Admitting: Certified Nurse Midwife

## 2014-02-01 ENCOUNTER — Encounter: Payer: Self-pay | Admitting: Certified Nurse Midwife

## 2014-02-01 VITALS — BP 128/70 | HR 96 | Resp 20 | Ht 65.75 in | Wt 216.0 lb

## 2014-02-01 DIAGNOSIS — N951 Menopausal and female climacteric states: Secondary | ICD-10-CM

## 2014-02-01 DIAGNOSIS — Z0000 Encounter for general adult medical examination without abnormal findings: Secondary | ICD-10-CM

## 2014-02-01 DIAGNOSIS — Z01419 Encounter for gynecological examination (general) (routine) without abnormal findings: Secondary | ICD-10-CM

## 2014-02-01 DIAGNOSIS — Z124 Encounter for screening for malignant neoplasm of cervix: Secondary | ICD-10-CM

## 2014-02-01 DIAGNOSIS — F411 Generalized anxiety disorder: Secondary | ICD-10-CM

## 2014-02-01 LAB — POCT URINALYSIS DIPSTICK
Bilirubin, UA: NEGATIVE
Blood, UA: NEGATIVE
Glucose, UA: NEGATIVE
Ketones, UA: NEGATIVE
NITRITE UA: NEGATIVE
PH UA: 5
PROTEIN UA: NEGATIVE
UROBILINOGEN UA: NEGATIVE

## 2014-02-01 MED ORDER — ESCITALOPRAM OXALATE 10 MG PO TABS: 10.0000 mg | ORAL_TABLET | Freq: Every day | ORAL | Status: DC

## 2014-02-01 NOTE — Progress Notes (Signed)
Note reviewed, agree with plan.  Monquie Fulgham, MD  

## 2014-02-01 NOTE — Patient Instructions (Signed)

## 2014-02-01 NOTE — Progress Notes (Signed)
53 y.o. G44P2002 Married Caucasian Fe here for annual exam. Occasional hot flash, but no night sweats. Lexapro working so well for anxiety, desires continuance. Sees PCP only prn. Needs fasting labs. Periods sporadic with ablation. LMP 4/15. No health issues today. Daughter made it through first year at [employer] !  Patient's last menstrual period was 10/20/2013.          Sexually active: No.  The current method of family planning is none.    Exercising: Yes.    Aerobics, walking 4 - 6 x weekly  Smoker:  no  Health Maintenance: Pap: 01/2012 Neg. HR HPV: Neg MMG: 09/2013 BIRADS1: Neg Colonoscopy: 2010 - Normal Repeat in 10 years  BMD: N/A TDaP: 06/2005 Labs: PCP    reports that she has never smoked. She has never used smokeless tobacco. She reports that she drinks about 2.4 - 3.6 ounces of alcohol per week. She reports that she does not use illicit drugs.  Past Medical History  Diagnosis Date  . Hyperlipidemia   . Osteoarthritis     Past Surgical History  Procedure Laterality Date  . Partial knee arthroplasty Left 4/08  . Laparoscopy    . Salpingectomy Left 8/06    secondary to cyst  . Ablation    . Breast reduction surgery    . Replacement total knee Left 1/10  . Cesarean section      Current Outpatient Prescriptions  Medication Sig Dispense Refill  . cetirizine (ZYRTEC) 10 MG tablet Take 10 mg by mouth daily.      Marland Kitchen escitalopram (LEXAPRO) 10 MG tablet TAKE ONE TABLET BY MOUTH ONE TIME DAILY   30 tablet  3  . Ibuprofen (ADVIL PO) Take by mouth as needed.      . Multiple Vitamins-Minerals (MULTIVITAMIN PO) Take by mouth as needed.      . Acetaminophen (TYLENOL PO) Take by mouth as needed.       No current facility-administered medications for this visit.    Family History  Problem Relation Age of Onset  . Heart attack Mother   . Hypertension Mother   . Diabetes Sister     ROS:  Pertinent items are noted in HPI.  Otherwise, a comprehensive ROS was negative.  Exam:   BP  128/70  Pulse 96  Resp 20  Ht 5' 5.75" (1.67 m)  Wt 216 lb (97.977 kg)  BMI 35.13 kg/m2  LMP 10/20/2013 Height: 5' 5.75" (167 cm)  Ht Readings from Last 3 Encounters:  02/01/14 5' 5.75" (1.67 m)  04/11/13 5' 5.75" (1.67 m)  01/10/13 5' 5.75" (1.67 m)    General appearance: alert, cooperative and appears stated age Head: Normocephalic, without obvious abnormality, atraumatic Neck: no adenopathy, supple, symmetrical, trachea midline and thyroid normal to inspection and palpation and non-palpable Lungs: clear to auscultation bilaterally Breasts: normal appearance, no masses or tenderness, No nipple retraction or dimpling, No nipple discharge or bleeding, No axillary or supraclavicular adenopathy Heart: regular rate and rhythm Abdomen: soft, non-tender; no masses,  no organomegaly Extremities: extremities normal, atraumatic, no cyanosis or edema Skin: Skin color, texture, turgor normal. No rashes or lesions Lymph nodes: Cervical, supraclavicular, and axillary nodes normal. No abnormal inguinal nodes palpated Neurologic: Grossly normal   Pelvic: External genitalia:  no lesions              Urethra:  normal appearing urethra with no masses, tenderness or lesions              Bartholin's and Skene's:  normal                 Vagina: normal appearing vagina with normal color and discharge, no lesions              Cervix: normal, non tender              Pap taken: Yes.   Bimanual Exam:  Uterus:  normal size, contour, position, consistency, mobility, non-tender and anteverted              Adnexa: normal adnexa and no mass, fullness, tenderness               Rectovaginal: Confirms               Anus:  normal appearance, patient refused rectal exam  A:  Well Woman with normal exam  Perimenopausal with sporadic periods, history of ablation  Anxiety Lexapro working well desires continuance  Fasting labs needed  P:   Reviewed health and wellness pertinent to exam  Discussed if no periods  or bleeding to advise  Rx Lexapro see order  Pap smear taken today with reflex  Patient will schedule appointment for fasting labs.  Labs: Lipid panel, CMP, TSH,Hgb A1c, CBC, FSH, Vitamin D  counseled on breast self exam, mammography screening, adequate intake of calcium and vitamin D, diet and exercise  return annually or prn  An After Visit Summary was printed and given to the patient.

## 2014-02-06 LAB — IPS PAP TEST WITH REFLEX TO HPV

## 2014-02-20 ENCOUNTER — Other Ambulatory Visit: Payer: BC Managed Care – PPO

## 2014-02-23 ENCOUNTER — Ambulatory Visit (INDEPENDENT_AMBULATORY_CARE_PROVIDER_SITE_OTHER): Payer: BC Managed Care – PPO

## 2014-02-23 DIAGNOSIS — R6889 Other general symptoms and signs: Secondary | ICD-10-CM

## 2014-02-23 DIAGNOSIS — Z0000 Encounter for general adult medical examination without abnormal findings: Secondary | ICD-10-CM

## 2014-02-23 DIAGNOSIS — N951 Menopausal and female climacteric states: Secondary | ICD-10-CM

## 2014-02-23 LAB — CBC
HCT: 39.4 % (ref 36.0–46.0)
HEMOGLOBIN: 13.2 g/dL (ref 12.0–15.0)
MCH: 29.7 pg (ref 26.0–34.0)
MCHC: 33.5 g/dL (ref 30.0–36.0)
MCV: 88.7 fL (ref 78.0–100.0)
Platelets: 325 10*3/uL (ref 150–400)
RBC: 4.44 MIL/uL (ref 3.87–5.11)
RDW: 15 % (ref 11.5–15.5)
WBC: 5.9 10*3/uL (ref 4.0–10.5)

## 2014-02-23 LAB — LIPID PANEL
CHOL/HDL RATIO: 3.1 ratio
Cholesterol: 206 mg/dL — ABNORMAL HIGH (ref 0–200)
HDL: 66 mg/dL (ref >39)
LDL Cholesterol: 117 mg/dL — ABNORMAL HIGH (ref 0–99)
Triglycerides: 116 mg/dL (ref <150)
VLDL: 23 mg/dL (ref 0–40)

## 2014-02-23 LAB — COMPREHENSIVE METABOLIC PANEL
ALK PHOS: 69 U/L (ref 39–117)
ALT: 19 U/L (ref 0–35)
AST: 25 U/L (ref 0–37)
Albumin: 4.2 g/dL (ref 3.5–5.2)
BILIRUBIN TOTAL: 0.4 mg/dL (ref 0.2–1.2)
BUN: 11 mg/dL (ref 6–23)
CALCIUM: 8.9 mg/dL (ref 8.4–10.5)
CHLORIDE: 102 meq/L (ref 96–112)
CO2: 26 mEq/L (ref 19–32)
CREATININE: 0.65 mg/dL (ref 0.50–1.10)
Glucose, Bld: 93 mg/dL (ref 70–99)
Potassium: 4 mEq/L (ref 3.5–5.3)
Sodium: 140 mEq/L (ref 135–145)
Total Protein: 7 g/dL (ref 6.0–8.3)

## 2014-02-23 LAB — HEMOGLOBIN A1C
Hgb A1c MFr Bld: 5.9 % — ABNORMAL HIGH (ref <5.7)
Mean Plasma Glucose: 123 mg/dL — ABNORMAL HIGH (ref <117)

## 2014-02-23 LAB — TSH: TSH: 1.785 u[IU]/mL (ref 0.350–4.500)

## 2014-02-24 LAB — FOLLICLE STIMULATING HORMONE: FSH: 13 m[IU]/mL

## 2014-02-24 LAB — VITAMIN D 25 HYDROXY (VIT D DEFICIENCY, FRACTURES): Vit D, 25-Hydroxy: 32 ng/mL (ref 30–89)

## 2014-05-14 ENCOUNTER — Encounter: Payer: Self-pay | Admitting: Certified Nurse Midwife

## 2014-05-29 ENCOUNTER — Other Ambulatory Visit: Payer: BC Managed Care – PPO

## 2014-05-30 ENCOUNTER — Telehealth: Payer: Self-pay | Admitting: Certified Nurse Midwife

## 2014-05-30 NOTE — Telephone Encounter (Signed)
Patient dnka her lab appointment 05/29/14. I left patient a message to call and reschedule.

## 2014-09-04 ENCOUNTER — Other Ambulatory Visit: Payer: Self-pay

## 2014-09-04 DIAGNOSIS — Z1231 Encounter for screening mammogram for malignant neoplasm of breast: Secondary | ICD-10-CM

## 2014-10-05 ENCOUNTER — Ambulatory Visit: Payer: Self-pay

## 2014-10-15 ENCOUNTER — Ambulatory Visit
Admission: RE | Admit: 2014-10-15 | Discharge: 2014-10-15 | Disposition: A | Discharge status: Home / Self Care | Payer: 59 | Source: Ambulatory Visit

## 2014-10-15 DIAGNOSIS — Z1231 Encounter for screening mammogram for malignant neoplasm of breast: Secondary | ICD-10-CM

## 2015-02-12 ENCOUNTER — Encounter: Payer: Self-pay | Admitting: Certified Nurse Midwife

## 2015-02-12 ENCOUNTER — Ambulatory Visit (INDEPENDENT_AMBULATORY_CARE_PROVIDER_SITE_OTHER): Payer: 59 | Admitting: Certified Nurse Midwife

## 2015-02-12 VITALS — BP 120/70 | HR 74 | Resp 20 | Ht 65.75 in | Wt 219.0 lb

## 2015-02-12 DIAGNOSIS — F419 Anxiety disorder, unspecified: Secondary | ICD-10-CM | POA: Diagnosis not present

## 2015-02-12 DIAGNOSIS — Z0000 Encounter for general adult medical examination without abnormal findings: Secondary | ICD-10-CM | POA: Diagnosis not present

## 2015-02-12 DIAGNOSIS — Z01419 Encounter for gynecological examination (general) (routine) without abnormal findings: Secondary | ICD-10-CM | POA: Diagnosis not present

## 2015-02-12 MED ORDER — ESCITALOPRAM OXALATE 10 MG PO TABS: 10.0000 mg | ORAL_TABLET | Freq: Every day | ORAL | Status: DC

## 2015-02-12 NOTE — Patient Instructions (Signed)

## 2015-02-12 NOTE — Progress Notes (Signed)
54 y.o. G30P2002 Married  Caucasian Fe here for annual exam . Menopausal no period or bleeding in one year now. Denies night sweats/hot flashes problems still occasional. No vaginal dryness.  Sees PCP prn. Would like to schedule fasting labs. No interested in TDAP today will do when she gets her flu vaccine with PCP. Lexapro working well with anxiety and desires continuance. Daughter now a Paramedic at [employer] and doing will with her OCD.  No other health issues today.  Patient's last menstrual period was 12/11/2013.          Sexually active: Yes.    The current method of family planning is tubal ligation.    Exercising: Yes.    walking,cardio classes Smoker:  no  Health Maintenance: Pap: 02-01-14 neg MMG: 10-15-14 category b density, birads 1:neg Colonoscopy:  2010 f/u 58yrs BMD:   none TDaP:  2006 declined todayl Labs: none Self breast exam: not done   reports that she has never smoked. She has never used smokeless tobacco. She reports that she drinks about 1.8 - 3.6 oz of alcohol per week. She reports that she does not use illicit drugs.  Past Medical History  Diagnosis Date  . Hyperlipidemia   . Osteoarthritis     Past Surgical History  Procedure Laterality Date  . Partial knee arthroplasty Left 4/08  . Laparoscopy    . Salpingectomy Left 8/06    secondary to cyst  . Ablation    . Breast reduction surgery    . Replacement total knee Left 1/10  . Cesarean section      Current Outpatient Prescriptions  Medication Sig Dispense Refill  . Acetaminophen (TYLENOL PO) Take by mouth as needed.    . diclofenac (VOLTAREN) 75 MG EC tablet     . escitalopram (LEXAPRO) 10 MG tablet Take 1 tablet (10 mg total) by mouth daily. 30 tablet 12  . Ibuprofen (ADVIL PO) Take by mouth as needed.    . Multiple Vitamins-Minerals (MULTIVITAMIN PO) Take by mouth as needed.     No current facility-administered medications for this visit.    Family History  Problem Relation Age of Onset  . Heart attack  Mother   . Hypertension Mother   . Diabetes Sister     ROS:  Pertinent items are noted in HPI.  Otherwise, a comprehensive ROS was negative.  Exam:   BP 120/70 mmHg  Pulse 74  Resp 20  Ht 5' 5.75" (1.67 m)  Wt 219 lb (99.338 kg)  BMI 35.62 kg/m2  LMP 12/11/2013 Height: 5' 5.75" (167 cm) Ht Readings from Last 3 Encounters:  02/12/15 5' 5.75" (1.67 m)  02/01/14 5' 5.75" (1.67 m)  04/11/13 5' 5.75" (1.67 m)    General appearance: alert, cooperative and appears stated age Head: Normocephalic, without obvious abnormality, atraumatic Neck: no adenopathy, supple, symmetrical, trachea midline and thyroid normal to inspection and palpation Lungs: clear to auscultation bilaterally Breasts: normal appearance, no masses or tenderness, No nipple retraction or dimpling, No nipple discharge or bleeding, No axillary or supraclavicular adenopathy Heart: regular rate and rhythm Abdomen: soft, non-tender; no masses,  no organomegaly Extremities: extremities normal, atraumatic, no cyanosis or edema Skin: Skin color, texture, turgor normal. No rashes or lesions Lymph nodes: Cervical, supraclavicular, and axillary nodes normal. No abnormal inguinal nodes palpated Neurologic: Grossly normal   Pelvic: External genitalia:  no lesions              Urethra:  normal appearing urethra with no masses, tenderness or  lesions              Bartholin's and Skene's: normal                 Vagina: normal appearing vagina with normal color and discharge, no lesions              Cervix: normal,non tender,no lesions              Pap taken: No. Bimanual Exam:  Uterus:  normal size, contour, position, consistency, mobility, non-tender              Adnexa: normal adnexa and no mass, fullness, tenderness               Rectovaginal: Confirms               Anus:  normal sphincter tone, no lesions  Chaperone present: Yes  A:  Well Woman with normal exam  Menopausal no HRT  Anxiety Lexapro working well desires  continuance  Immunization update  Fasting labs desires scheduling  P:   Reviewed health and wellness pertinent to exam  Discussed need to advise if vaginal bleeding or menopausal symptoms are a problem.  Discussed risks and benefits of use. Patient aware, but feels also helps with menopausal symptoms.  Rx Lexapro see order  Declines TDAP  Will schedule for fasting labs  CMP,Lipid panel, CBC, TSH, Vit. D  Pap smear not taken today  counseled on breast self exam, mammography screening, adequate intake of calcium and vitamin D, diet and exercise. Discussed weight management to decrease other health problems. Patient trying to start with dietary changes.  return annually or prn  An After Visit Summary was printed and given to the patient.

## 2015-02-13 NOTE — Progress Notes (Signed)
Reviewed personally.  M. Suzanne Indonesia Mckeough, MD.  

## 2015-02-21 ENCOUNTER — Other Ambulatory Visit: Payer: 59

## 2015-10-08 ENCOUNTER — Other Ambulatory Visit: Payer: Self-pay

## 2015-10-08 DIAGNOSIS — Z1231 Encounter for screening mammogram for malignant neoplasm of breast: Secondary | ICD-10-CM

## 2015-10-28 ENCOUNTER — Ambulatory Visit
Admission: RE | Admit: 2015-10-28 | Discharge: 2015-10-28 | Disposition: A | Discharge status: Home / Self Care | Payer: 59 | Source: Ambulatory Visit

## 2015-10-28 DIAGNOSIS — Z1231 Encounter for screening mammogram for malignant neoplasm of breast: Secondary | ICD-10-CM

## 2016-02-13 ENCOUNTER — Ambulatory Visit: Payer: 59 | Admitting: Certified Nurse Midwife

## 2016-02-20 ENCOUNTER — Other Ambulatory Visit: Payer: Self-pay | Admitting: Certified Nurse Midwife

## 2016-02-20 DIAGNOSIS — F419 Anxiety disorder, unspecified: Secondary | ICD-10-CM

## 2016-02-20 NOTE — Telephone Encounter (Signed)
Medication refill request: Escitalopram 10mg  Last AEX:  02/12/15 DL Next AEX: 03/19/16  Last MMG (if hormonal medication request): 10/28/15 BIRADS1 negative Refill authorized: 02/12/15 #30 w/12 refills; today #30 w/0 refill?

## 2016-02-20 NOTE — Telephone Encounter (Signed)
Will ok one refill

## 2016-03-19 ENCOUNTER — Telehealth: Payer: Self-pay | Admitting: Certified Nurse Midwife

## 2016-03-19 ENCOUNTER — Ambulatory Visit: Payer: 59 | Admitting: Certified Nurse Midwife

## 2016-03-19 ENCOUNTER — Encounter: Payer: Self-pay | Admitting: Certified Nurse Midwife

## 2016-03-19 NOTE — Telephone Encounter (Signed)
Patient cancelled appt.

## 2016-03-19 NOTE — Telephone Encounter (Signed)
Patient called after-hours yesterday and cancelled her AEX for this afternoon. She said, "I am in the process of moving and the moving truck company changed the time it is coming. I cannot make my appointment."  I called the patient and left a message this morning to call back and reschedule.

## 2016-03-19 NOTE — Progress Notes (Unsigned)
55 y.o. G64P2002 Married  Caucasian Fe here for annual exam.    Patient's last menstrual period was 12/11/2013.          Sexually active: {yes no:314532}  The current method of family planning is {contraception:315051}.    Exercising: {yes no:314532}  {types:19826} Smoker:  {YES NO:22349}  Health Maintenance: Pap: 02-01-14 neg MMG:  10-28-15 category b density birads 1:neg Colonoscopy:  2010 neg f/u 56yrs BMD:   none TDaP:  2006  Shingles: *** Pneumonia: *** Hep C and HIV: *** Labs: ***   reports that she has never smoked. She has never used smokeless tobacco. She reports that she drinks about 1.8 - 3.6 oz of alcohol per week . She reports that she does not use drugs.  Past Medical History:  Diagnosis Date  . Hyperlipidemia   . Osteoarthritis     Past Surgical History:  Procedure Laterality Date  . ABLATION    . BREAST REDUCTION SURGERY    . CESAREAN SECTION    . LAPAROSCOPY    . PARTIAL KNEE ARTHROPLASTY Left 4/08  . REPLACEMENT TOTAL KNEE Left 1/10  . SALPINGECTOMY Left 8/06   secondary to cyst    Current Outpatient Prescriptions  Medication Sig Dispense Refill  . Acetaminophen (TYLENOL PO) Take by mouth as needed.    . diclofenac (VOLTAREN) 75 MG EC tablet     . escitalopram (LEXAPRO) 10 MG tablet TAKE 1 TABLET (10 MG TOTAL) BY MOUTH DAILY. 30 tablet 0  . Ibuprofen (ADVIL PO) Take by mouth as needed.    . Multiple Vitamins-Minerals (MULTIVITAMIN PO) Take by mouth as needed.     No current facility-administered medications for this visit.     Family History  Problem Relation Age of Onset  . Heart attack Mother   . Hypertension Mother   . Diabetes Sister     ROS:  Pertinent items are noted in HPI.  Otherwise, a comprehensive ROS was negative.  Exam:   LMP 12/11/2013    Ht Readings from Last 3 Encounters:  02/12/15 5' 5.75" (1.67 m)  02/01/14 5' 5.75" (1.67 m)  04/11/13 5' 5.75" (1.67 m)    General appearance: alert, cooperative and appears stated  age Head: Normocephalic, without obvious abnormality, atraumatic Neck: no adenopathy, supple, symmetrical, trachea midline and thyroid {EXAM; THYROID:18604} Lungs: clear to auscultation bilaterally Breasts: {Exam; breast:13139::"normal appearance, no masses or tenderness"} Heart: regular rate and rhythm Abdomen: soft, non-tender; no masses,  no organomegaly Extremities: extremities normal, atraumatic, no cyanosis or edema Skin: Skin color, texture, turgor normal. No rashes or lesions Lymph nodes: Cervical, supraclavicular, and axillary nodes normal. No abnormal inguinal nodes palpated Neurologic: Grossly normal   Pelvic: External genitalia:  no lesions              Urethra:  normal appearing urethra with no masses, tenderness or lesions              Bartholin's and Skene's: normal                 Vagina: normal appearing vagina with normal color and discharge, no lesions              Cervix: {exam; cervix:14595}              Pap taken: {yes no:314532} Bimanual Exam:  Uterus:  {exam; uterus:12215}              Adnexa: {exam; adnexa:12223}  Rectovaginal: Confirms               Anus:  normal sphincter tone, no lesions  Chaperone present: ***  A:  Well Woman with normal exam  P:   Reviewed health and wellness pertinent to exam  Pap smear as above  {plan; gyn:5269::"mammogram","pap smear","return annually or prn"}  An After Visit Summary was printed and given to the patient.

## 2016-03-24 ENCOUNTER — Other Ambulatory Visit: Payer: Self-pay | Admitting: Certified Nurse Midwife

## 2016-03-24 DIAGNOSIS — F419 Anxiety disorder, unspecified: Secondary | ICD-10-CM

## 2016-03-24 NOTE — Telephone Encounter (Signed)
Medication refill request: Escitalopram Last AEX:  02/12/15 DL Next AEX: Not scheduled  Last MMG (if hormonal medication request): 10/28/15 [id number] Refill authorized: 02/20/16 #[id number]. Please advise. Thank you.

## 2016-03-24 NOTE — Telephone Encounter (Signed)
Will refill x 1 only. Will need to schedule aex

## 2016-03-26 NOTE — Telephone Encounter (Signed)
Left message on patient's voicemail to call office back and schedule her AEX before anymore refills can be sent out. -sco

## 2016-04-17 ENCOUNTER — Telehealth: Payer: Self-pay | Admitting: Nurse Practitioner

## 2016-04-17 DIAGNOSIS — Z0000 Encounter for general adult medical examination without abnormal findings: Secondary | ICD-10-CM

## 2016-04-17 NOTE — Telephone Encounter (Signed)
Patient is scheduled for fasting labs for 04/27/16 and will need an order for labs.

## 2016-04-17 NOTE — Telephone Encounter (Signed)
Patient has a lab appointment scheduled on 04/27/2016 and an aex on 05/04/2016. Future orders placed for hemoglobin A1C, CBC, vitamin D, TSH, lipid panel, and CMP.  [name], FNP are there any additional labs that need to be added?

## 2016-04-17 NOTE — Telephone Encounter (Signed)
The only thing I would add is Hep C and HIV if not done elsewhere.

## 2016-04-17 NOTE — Telephone Encounter (Signed)
Spoke with patient. Patient reports she has not had Hep C or HIV testing performed. She would like to have this done with lab work on 04/27/2016. Lab orders added for Hep C antibody and HIV antibody.  Routing to provider for final review. Patient agreeable to disposition. Will close encounter.

## 2016-04-26 ENCOUNTER — Other Ambulatory Visit: Payer: Self-pay | Admitting: Certified Nurse Midwife

## 2016-04-26 DIAGNOSIS — F419 Anxiety disorder, unspecified: Secondary | ICD-10-CM

## 2016-04-27 ENCOUNTER — Other Ambulatory Visit (INDEPENDENT_AMBULATORY_CARE_PROVIDER_SITE_OTHER): Payer: 59

## 2016-04-27 DIAGNOSIS — Z0000 Encounter for general adult medical examination without abnormal findings: Secondary | ICD-10-CM

## 2016-04-27 LAB — LIPID PANEL
Cholesterol: 250 mg/dL — ABNORMAL HIGH (ref 125–200)
HDL: 62 mg/dL (ref >=46)
LDL Cholesterol: 152 mg/dL — ABNORMAL HIGH (ref <130)
TRIGLYCERIDES: 181 mg/dL — AB (ref <150)
Total CHOL/HDL Ratio: 4 Ratio (ref <=5.0)
VLDL: 36 mg/dL — ABNORMAL HIGH (ref <30)

## 2016-04-27 LAB — TSH: TSH: 2.76 mIU/L

## 2016-04-27 LAB — COMPREHENSIVE METABOLIC PANEL
ALK PHOS: 82 U/L (ref 33–130)
ALT: 19 U/L (ref 6–29)
AST: 22 U/L (ref 10–35)
Albumin: 4.2 g/dL (ref 3.6–5.1)
BILIRUBIN TOTAL: 0.4 mg/dL (ref 0.2–1.2)
BUN: 14 mg/dL (ref 7–25)
CO2: 25 mmol/L (ref 20–31)
CREATININE: 0.58 mg/dL (ref 0.50–1.05)
Calcium: 9.4 mg/dL (ref 8.6–10.4)
Chloride: 101 mmol/L (ref 98–110)
GLUCOSE: 99 mg/dL (ref 65–99)
Potassium: 4.2 mmol/L (ref 3.5–5.3)
Sodium: 141 mmol/L (ref 135–146)
TOTAL PROTEIN: 7.3 g/dL (ref 6.1–8.1)

## 2016-04-27 LAB — CBC
HEMATOCRIT: 40.5 % (ref 35.0–45.0)
HEMOGLOBIN: 12.6 g/dL (ref 11.7–15.5)
MCH: 28.5 pg (ref 27.0–33.0)
MCHC: 31.1 g/dL — AB (ref 32.0–36.0)
MCV: 91.6 fL (ref 80.0–100.0)
MPV: 9.6 fL (ref 7.5–12.5)
Platelets: 336 10*3/uL (ref 140–400)
RBC: 4.42 MIL/uL (ref 3.80–5.10)
RDW: 14.5 % (ref 11.0–15.0)
WBC: 6.2 10*3/uL (ref 3.8–10.8)

## 2016-04-27 NOTE — Telephone Encounter (Signed)
Medication refill request: Lexapro Last AEX:  02-12-15 Next AEX: 05-04-16  Last MMG (if hormonal medication request): 10-29-15 WNL Refill authorized: please advise

## 2016-04-28 LAB — HIV ANTIBODY (ROUTINE TESTING W REFLEX): HIV 1&2 Ab, 4th Generation: NONREACTIVE

## 2016-04-28 LAB — HEPATITIS C ANTIBODY: HCV AB: NEGATIVE

## 2016-04-28 LAB — HEMOGLOBIN A1C
HEMOGLOBIN A1C: 5.7 % — AB (ref <5.7)
MEAN PLASMA GLUCOSE: 117 mg/dL

## 2016-04-28 LAB — VITAMIN D 25 HYDROXY (VIT D DEFICIENCY, FRACTURES): VIT D 25 HYDROXY: 20 ng/mL — AB (ref 30–100)

## 2016-05-01 ENCOUNTER — Encounter: Payer: Self-pay | Admitting: Nurse Practitioner

## 2016-05-04 ENCOUNTER — Ambulatory Visit (INDEPENDENT_AMBULATORY_CARE_PROVIDER_SITE_OTHER): Payer: 59 | Admitting: Nurse Practitioner

## 2016-05-04 ENCOUNTER — Encounter: Payer: Self-pay | Admitting: Nurse Practitioner

## 2016-05-04 VITALS — BP 124/80 | HR 60 | Ht 65.5 in | Wt 222.0 lb

## 2016-05-04 DIAGNOSIS — Z1211 Encounter for screening for malignant neoplasm of colon: Secondary | ICD-10-CM | POA: Diagnosis not present

## 2016-05-04 DIAGNOSIS — F419 Anxiety disorder, unspecified: Secondary | ICD-10-CM | POA: Diagnosis not present

## 2016-05-04 DIAGNOSIS — Z87898 Personal history of other specified conditions: Secondary | ICD-10-CM | POA: Diagnosis not present

## 2016-05-04 DIAGNOSIS — Z8639 Personal history of other endocrine, nutritional and metabolic disease: Secondary | ICD-10-CM

## 2016-05-04 DIAGNOSIS — E559 Vitamin D deficiency, unspecified: Secondary | ICD-10-CM | POA: Diagnosis not present

## 2016-05-04 DIAGNOSIS — Z0000 Encounter for general adult medical examination without abnormal findings: Secondary | ICD-10-CM | POA: Diagnosis not present

## 2016-05-04 DIAGNOSIS — Z01419 Encounter for gynecological examination (general) (routine) without abnormal findings: Secondary | ICD-10-CM | POA: Diagnosis not present

## 2016-05-04 DIAGNOSIS — E782 Mixed hyperlipidemia: Secondary | ICD-10-CM

## 2016-05-04 MED ORDER — ESCITALOPRAM OXALATE 10 MG PO TABS: 10.0000 mg | ORAL_TABLET | Freq: Every day | ORAL | 4 refills | Status: DC

## 2016-05-04 MED ORDER — VITAMIN D (ERGOCALCIFEROL) 1.25 MG (50000 UNIT) PO CAPS: 50000.0000 [IU] | ORAL_CAPSULE | ORAL | 3 refills | Status: DC

## 2016-05-04 NOTE — Progress Notes (Signed)
Patient ID: [name], female   DOB: 14-Mar-1961, 55 y.o.   MRN: [id number]  55 y.o. G81P2002 Divorced Caucasian Fe here for annual exam.  She is having no vaso symptoms.  Her anxiety level has been up and down due to divorce after 30 yrs..  Her and son have a different home now and they are doing OK.  She is not dating and not SA.  No new diagnosis other than stress fracture of right foot about ~ 6 weeks ago.  She has been told to get BMD.  Labs done a week ago shows normal calcium and TSH levels.  She does have low Vit D and will start on med's.  She also will try diet for hypercholesterolemia and plan to recheck that in 3 months.  Patient's last menstrual period was 12/11/2013.          Sexually active: Not currently  The current method of family planning is post menopausal status.    Exercising: Yes.  Weights and walking Smoker:  no  Health Maintenance: Pap: 02/01/14, Negative  MMG: 10/28/15, Bi-Rads 1: Negative  Colonoscopy:  2010 f/u 10 yrs -   IFOB given today BMD: Never TDaP: 06/12/05  Hep C and HIV: 04/27/16 Labs: 04/27/16 in EPIC  Urine: Negative   reports that she has never smoked. She has never used smokeless tobacco. She reports that she drinks about 1.8 - 3.6 oz of alcohol per week . She reports that she does not use drugs.  Past Medical History:  Diagnosis Date  . Hyperlipidemia   . Osteoarthritis     Past Surgical History:  Procedure Laterality Date  . ABLATION    . BREAST REDUCTION SURGERY    . CESAREAN SECTION    . LAPAROSCOPY    . PARTIAL KNEE ARTHROPLASTY Left 4/08  . REPLACEMENT TOTAL KNEE Left 1/10  . SALPINGECTOMY Left 8/06   secondary to cyst    Current Outpatient Prescriptions  Medication Sig Dispense Refill  . Acetaminophen (TYLENOL PO) Take by mouth as needed.    . diclofenac (VOLTAREN) 75 MG EC tablet     . escitalopram (LEXAPRO) 10 MG tablet TAKE 1 TABLET (10 MG TOTAL) BY MOUTH DAILY. 30 tablet 0  . Ibuprofen (ADVIL PO) Take by mouth as needed.     . Multiple Vitamins-Minerals (MULTIVITAMIN PO) Take by mouth as needed.     No current facility-administered medications for this visit.     Family History  Problem Relation Age of Onset  . Heart attack Mother   . Hypertension Mother   . Diabetes Sister     ROS:  Pertinent items are noted in HPI.  Otherwise, a comprehensive ROS was negative.  Exam:   LMP 12/11/2013    Ht Readings from Last 3 Encounters:  02/12/15 5' 5.75" (1.67 m)  02/01/14 5' 5.75" (1.67 m)  04/11/13 5' 5.75" (1.67 m)    General appearance: alert, cooperative and appears stated age Head: Normocephalic, without obvious abnormality, atraumatic Neck: no adenopathy, supple, symmetrical, trachea midline and thyroid normal to inspection and palpation Lungs: clear to auscultation bilaterally Breasts: normal appearance, no masses or tenderness Heart: regular rate and rhythm Abdomen: soft, non-tender; no masses,  no organomegaly Extremities: extremities normal, atraumatic, no cyanosis or edema Skin: Skin color, texture, turgor normal. No rashes or lesions Lymph nodes: Cervical, supraclavicular, and axillary nodes normal. No abnormal inguinal nodes palpated Neurologic: Grossly normal   Pelvic: External genitalia:  no lesions  Urethra:  normal appearing urethra with no masses, tenderness or lesions              Bartholin's and Skene's: normal                 Vagina: normal appearing vagina with normal color and discharge, no lesions              Cervix: anteverted              Pap taken: Yes.   Bimanual Exam:  Uterus:  normal size, contour, position, consistency, mobility, non-tender              Adnexa: no mass, fullness, tenderness               Rectovaginal: Confirms               Anus:  normal sphincter tone, no lesions  Chaperone present: yes  A:  Well Woman with normal exam  Menopausal no HRT             Anxiety Lexapro working well desires continuance             Immunization update              Situational anxiety with divorce  Stress fracture right foot  P:   Reviewed health and wellness pertinent to exam  Pap smear was done - declines STD's  Mammogram is due 10/2016  IFOB is given today  Refill on Lexapro and new RX for Vit D   Plan to recheck Lipid and Vit D in 3 months fasting  Order for BMD sent to [facility] on breast self exam, mammography screening, adequate intake of calcium and vitamin D, diet and exercise, Kegel's exercises return annually or prn  An After Visit Summary was printed and given to the patient.

## 2016-05-04 NOTE — Patient Instructions (Signed)

## 2016-05-06 ENCOUNTER — Other Ambulatory Visit: Payer: Self-pay | Admitting: Nurse Practitioner

## 2016-05-06 LAB — IPS PAP TEST WITH HPV

## 2016-05-06 MED ORDER — METRONIDAZOLE 0.75 % VA GEL: 1.0000 | Freq: Every day | VAGINAL | 0 refills | Status: DC

## 2016-05-08 NOTE — Progress Notes (Signed)
Encounter reviewed by Dr. Brook Amundson C. Silva.  

## 2016-06-12 ENCOUNTER — Encounter (HOSPITAL_COMMUNITY): Payer: Self-pay | Admitting: Emergency Medicine

## 2016-06-12 ENCOUNTER — Ambulatory Visit (HOSPITAL_COMMUNITY)
Admission: EM | Admit: 2016-06-12 | Discharge: 2016-06-12 | Disposition: A | Discharge status: Home / Self Care | Payer: 59 | Attending: Emergency Medicine | Admitting: Emergency Medicine

## 2016-06-12 ENCOUNTER — Ambulatory Visit (INDEPENDENT_AMBULATORY_CARE_PROVIDER_SITE_OTHER): Payer: 59

## 2016-06-12 DIAGNOSIS — M5412 Radiculopathy, cervical region: Secondary | ICD-10-CM

## 2016-06-12 MED ORDER — PREDNISONE 50 MG PO TABS: ORAL_TABLET | ORAL | 0 refills | Status: DC

## 2016-06-12 MED ORDER — GABAPENTIN 300 MG PO CAPS: 300.0000 mg | ORAL_CAPSULE | Freq: Three times a day (TID) | ORAL | 0 refills | Status: DC

## 2016-06-12 MED ORDER — METHYLPREDNISOLONE ACETATE 80 MG/ML IJ SUSP
INTRAMUSCULAR | Status: AC
  Filled 2016-06-12: qty 1

## 2016-06-12 MED ORDER — METHYLPREDNISOLONE ACETATE 80 MG/ML IJ SUSP
80.0000 mg | Freq: Once | INTRAMUSCULAR | Status: AC
  Administered 2016-06-12: 80 mg via INTRAMUSCULAR

## 2016-06-12 NOTE — ED Provider Notes (Signed)
[place]    CSN: [id number] Arrival date & time: 06/12/16  1758     History   Chief Complaint Chief Complaint  Patient presents with  . Neck Pain    HPI [name] is a 55 y.o. female.   HPI She is a 55 year old woman here for evaluation of neck pain. She states it started about 2 weeks ago when she woke up with a crick in the left neck. She saw a chiropractor, who did several adjustments, and the pain resolved. On Monday, the pain recurred in the left neck. She did see the chiropractor again with some improvement, but the pain has been persistent. It is in the left neck and radiates down the lateral arm to the elbow. It is worse with looking to the left. No injury or trauma. No weakness. She has taken Vicodin without improvement.  Past Medical History:  Diagnosis Date  . Hyperlipidemia   . Osteoarthritis     Patient Active Problem List   Diagnosis Date Noted  . Hyperlipidemia 04/06/2011    Past Surgical History:  Procedure Laterality Date  . ABLATION    . BREAST REDUCTION SURGERY    . CESAREAN SECTION    . LAPAROSCOPY    . PARTIAL KNEE ARTHROPLASTY Left 4/08  . REPLACEMENT TOTAL KNEE Left 1/10  . SALPINGECTOMY Left 8/06   secondary to cyst    OB History    Gravida Para Term Preterm AB Living   2 2 2  0 0 2   SAB TAB Ectopic Multiple Live Births   0 0 0 0 2       Home Medications    Prior to Admission medications   Medication Sig Start Date End Date Taking? Authorizing Provider  Acetaminophen (TYLENOL PO) Take by mouth as needed.   Yes Historical Provider, MD  escitalopram (LEXAPRO) 10 MG tablet Take 1 tablet (10 mg total) by mouth daily. 05/04/16  Yes [name], FNP  Ibuprofen (ADVIL PO) Take by mouth as needed.   Yes Historical Provider, MD  Multiple Vitamins-Minerals (MULTIVITAMIN PO) Take by mouth as needed.   Yes Historical Provider, MD  Vitamin D, Ergocalciferol, (DRISDOL) 50000 units CAPS capsule Take 1 capsule (50,000 Units  total) by mouth every 7 (seven) days. 05/04/16  Yes [name], FNP  gabapentin (NEURONTIN) 300 MG capsule Take 1 capsule (300 mg total) by mouth 3 (three) times daily. 06/12/16   Linda Overly, MD  predniSONE (DELTASONE) 50 MG tablet Take 1 pill daily for 5 days. 06/12/16   Linda Overly, MD    Family History Family History  Problem Relation Age of Onset  . Heart attack Mother   . Hypertension Mother   . Diabetes Sister     Social History Social History  Substance Use Topics  . Smoking status: Never Smoker  . Smokeless tobacco: Never Used  . Alcohol use 1.8 - 3.6 oz/week    3 - 6 Glasses of wine per week     Comment: social     Allergies   Codeine   Review of Systems Review of Systems As in history of present illness  Physical Exam Triage Vital Signs ED Triage Vitals  Enc Vitals Group     BP 06/12/16 1824 164/82     Pulse Rate 06/12/16 1824 76     Resp 06/12/16 1824 20     Temp 06/12/16 1824 98.2 F (36.8 C)     Temp Source 06/12/16 1824 Oral  SpO2 06/12/16 1824 98 %     Weight --      Height --      Head Circumference --      Peak Flow --      Pain Score 06/12/16 1823 8     Pain Loc --      Pain Edu? --      Excl. in [place]? --    No data found.   Updated Vital Signs BP 164/82 (BP Location: Left Arm)   Pulse 76   Temp 98.2 F (36.8 C) (Oral)   Resp 20   LMP 12/11/2013 (Approximate)   SpO2 98%   Visual Acuity Right Eye Distance:   Left Eye Distance:   Bilateral Distance:    Right Eye Near:   Left Eye Near:    Bilateral Near:     Physical Exam  Constitutional: She is oriented to person, place, and time. She appears well-developed and well-nourished. No distress.  Neck: Normal range of motion.  No muscle spasm or point tenderness. Positive Spurling's on the left. 5 out of 5 strength in bilateral upper extremities.  Cardiovascular: Normal rate.   Pulmonary/Chest: Effort normal.  Neurological: She is alert and oriented to person, place, and  time.     UC Treatments / Results  Labs (all labs ordered are listed, but only abnormal results are displayed) Labs Reviewed - No data to display  EKG  EKG Interpretation None       Radiology Dg Cervical Spine Complete  Result Date: 06/12/2016 CLINICAL DATA:  Neck pain for 2 weeks, mostly on the LEFT. EXAM: CERVICAL SPINE - COMPLETE 4+ VIEW COMPARISON:  Cervical spine radiographs August 09, 2009 FINDINGS: Cervical vertebral bodies intact and aligned. Moderate to severe C5-6 disc height loss has progressed with endplate spurring. Moderate to severe mid cervical facet arthropathy. Severe LEFT C3-4 and LEFT C5-6 neural foraminal narrowing. Mild RIGHT C3-4, moderate to severe RIGHT C5-6 neural foraminal narrowing. No destructive bony lesions. Prevertebral paraspinal soft tissue planes are normal. IMPRESSION: Progressed degenerative changes cervical spine with severe LEFT C3-4 and LEFT C5-6 neural foraminal narrowing. No acute fracture deformity or malalignment. Electronically Signed   By: Elon Alas M.D.   On: 06/12/2016 19:28    Procedures Procedures (including critical care time)  Medications Ordered in UC Medications  methylPREDNISolone acetate (DEPO-MEDROL) injection 80 mg (80 mg Intramuscular Given 06/12/16 1948)     Initial Impression / Assessment and Plan / UC Course  I have reviewed the triage vital signs and the nursing notes.  Pertinent labs & imaging results that were available during my care of the patient were reviewed by me and considered in my medical decision making (see chart for details).  Clinical Course     X-ray shows progression of degenerative disc disease with foraminal narrowing consistent with her symptoms. We'll treat with steroids to help with swelling and inflammation. Continue ibuprofen. Gabapentin to help with nerve pain. If not improving, follow-up with orthopedics for additional management and possible physical therapy.  Final Clinical  Impressions(s) / UC Diagnoses   Final diagnoses:  Cervical radiculopathy    New Prescriptions Discharge Medication List as of 06/12/2016  7:34 PM    START taking these medications   Details  gabapentin (NEURONTIN) 300 MG capsule Take 1 capsule (300 mg total) by mouth 3 (three) times daily., Starting Fri 06/12/2016, Normal    predniSONE (DELTASONE) 50 MG tablet Take 1 pill daily for 5 days., Normal  Linda Overly, MD 06/12/16 [phone]

## 2016-06-12 NOTE — ED Triage Notes (Signed)
Here for neck pain on left side onset 2 weeks... Saw chiropractor w/temp relief but pain has returned  Denies inj/trauma  Pain increases w/movement  A&O x4... NAD

## 2016-06-12 NOTE — Discharge Instructions (Signed)
You have some progressive arthritis in your neck. This has caused some irritation to the nerves on the left side. We gave you a steroid shot to help with the inflammation. Please take prednisone daily for 5 days. Take this with food. Continue ibuprofen 3 times a day. Use the gabapentin at bedtime to help with nerve pain. You can take this 3 times a day, but it may make you very drowsy. You should see improvement over the next week. If this becomes a recurrent problem, please follow-up with an orthopedic doctor.  I have given contact information for Dr. Ninfa Linden.

## 2016-06-25 ENCOUNTER — Encounter (HOSPITAL_COMMUNITY): Payer: Self-pay | Admitting: *Deleted

## 2016-06-25 ENCOUNTER — Ambulatory Visit (HOSPITAL_COMMUNITY)
Admission: EM | Admit: 2016-06-25 | Discharge: 2016-06-25 | Disposition: A | Discharge status: Home / Self Care | Payer: 59 | Attending: Family Medicine | Admitting: Family Medicine

## 2016-06-25 DIAGNOSIS — J029 Acute pharyngitis, unspecified: Secondary | ICD-10-CM

## 2016-06-25 DIAGNOSIS — E785 Hyperlipidemia, unspecified: Secondary | ICD-10-CM | POA: Insufficient documentation

## 2016-06-25 DIAGNOSIS — M1990 Unspecified osteoarthritis, unspecified site: Secondary | ICD-10-CM | POA: Insufficient documentation

## 2016-06-25 LAB — POCT RAPID STREP A: STREPTOCOCCUS, GROUP A SCREEN (DIRECT): NEGATIVE

## 2016-06-25 MED ORDER — IPRATROPIUM BROMIDE 0.06 % NA SOLN: 2.0000 | Freq: Four times a day (QID) | NASAL | 1 refills | Status: DC

## 2016-06-25 NOTE — ED Triage Notes (Signed)
Fever     sorethroat   Body  Aches   Chills   Taking  otc  meds  No  releif

## 2016-06-25 NOTE — Discharge Instructions (Signed)
Drink plenty of fluids as discussed, use medicine as prescribed, and mucinex or delsym for cough. Return or see your doctor if further problems °

## 2016-06-25 NOTE — ED Provider Notes (Signed)
[name]    CSN: [id number] Arrival date & time: 06/25/16  1645     History   Chief Complaint Chief Complaint  Patient presents with  . Sore Throat    HPI [name] is a 55 y.o. female.   The history is provided by the patient.  Sore Throat  This is a new problem. The current episode started yesterday. The problem has been gradually worsening. Pertinent negatives include no chest pain, no abdominal pain, no headaches and no shortness of breath. The symptoms are aggravated by swallowing.    Past Medical History:  Diagnosis Date  . Hyperlipidemia   . Osteoarthritis     Patient Active Problem List   Diagnosis Date Noted  . Hyperlipidemia 04/06/2011    Past Surgical History:  Procedure Laterality Date  . ABLATION    . BREAST REDUCTION SURGERY    . CESAREAN SECTION    . LAPAROSCOPY    . PARTIAL KNEE ARTHROPLASTY Left 4/08  . REPLACEMENT TOTAL KNEE Left 1/10  . SALPINGECTOMY Left 8/06   secondary to cyst    OB History    Gravida Para Term Preterm AB Living   2 2 2  0 0 2   SAB TAB Ectopic Multiple Live Births   0 0 0 0 2       Home Medications    Prior to Admission medications   Medication Sig Start Date End Date Taking? Authorizing Provider  Acetaminophen (TYLENOL PO) Take by mouth as needed.    Historical Provider, MD  escitalopram (LEXAPRO) 10 MG tablet Take 1 tablet (10 mg total) by mouth daily. 05/04/16   [name], FNP  gabapentin (NEURONTIN) 300 MG capsule Take 1 capsule (300 mg total) by mouth 3 (three) times daily. 06/12/16   Melony Overly, MD  Ibuprofen (ADVIL PO) Take by mouth as needed.    Historical Provider, MD  Multiple Vitamins-Minerals (MULTIVITAMIN PO) Take by mouth as needed.    Historical Provider, MD  predniSONE (DELTASONE) 50 MG tablet Take 1 pill daily for 5 days. 06/12/16   Melony Overly, MD  Vitamin D, Ergocalciferol, (DRISDOL) 50000 units CAPS capsule Take 1 capsule (50,000 Units total) by mouth every 7  (seven) days. 05/04/16   [name], FNP    Family History Family History  Problem Relation Age of Onset  . Heart attack Mother   . Hypertension Mother   . Diabetes Sister     Social History Social History  Substance Use Topics  . Smoking status: Never Smoker  . Smokeless tobacco: Never Used  . Alcohol use 1.8 - 3.6 oz/week    3 - 6 Glasses of wine per week     Comment: social     Allergies   Codeine   Review of Systems Review of Systems  Constitutional: Positive for chills.  HENT: Positive for sore throat. Negative for congestion, postnasal drip and rhinorrhea.   Respiratory: Negative.  Negative for shortness of breath.   Cardiovascular: Negative.  Negative for chest pain.  Gastrointestinal: Negative.  Negative for abdominal pain.  Musculoskeletal: Negative.   Neurological: Negative for headaches.  All other systems reviewed and are negative.    Physical Exam Triage Vital Signs ED Triage Vitals  Enc Vitals Group     BP 06/25/16 1715 183/87     Pulse Rate 06/25/16 1715 100     Resp 06/25/16 1715 16     Temp 06/25/16 1715 101.3 F (38.5 C)  Temp Source 06/25/16 1715 Oral     SpO2 06/25/16 1715 100 %     Weight --      Height --      Head Circumference --      Peak Flow --      Pain Score 06/25/16 1721 8     Pain Loc --      Pain Edu? --      Excl. in [place]? --    No data found.   Updated Vital Signs BP 183/87 (BP Location: Left Arm)   Pulse 100   Temp 101.3 F (38.5 C) (Oral)   Resp 16   LMP 12/11/2013 (Approximate)   SpO2 100%   Visual Acuity Right Eye Distance:   Left Eye Distance:   Bilateral Distance:    Right Eye Near:   Left Eye Near:    Bilateral Near:     Physical Exam  Constitutional: She is oriented to person, place, and time. She appears well-developed and well-nourished. No distress.  HENT:  Right Ear: External ear normal.  Left Ear: External ear normal.  Nose: Nose normal.  Mouth/Throat: Oropharynx is clear and  moist. No oropharyngeal exudate.  Eyes: Conjunctivae are normal. Pupils are equal, round, and reactive to light.  Neck: Normal range of motion. Neck supple.  Cardiovascular: Normal rate, regular rhythm, normal heart sounds and intact distal pulses.   Pulmonary/Chest: Effort normal and breath sounds normal.  Lymphadenopathy:    She has no cervical adenopathy.  Neurological: She is alert and oriented to person, place, and time.  Skin: Skin is warm and dry.     UC Treatments / Results  Labs (all labs ordered are listed, but only abnormal results are displayed) Labs Reviewed  POCT RAPID STREP A  strep neg.  EKG  EKG Interpretation None       Radiology No results found.  Procedures Procedures (including critical care time)  Medications Ordered in UC Medications - No data to display   Initial Impression / Assessment and Plan / UC Course  I have reviewed the triage vital signs and the nursing notes.  Pertinent labs & imaging results that were available during my care of the patient were reviewed by me and considered in my medical decision making (see chart for details).  Clinical Course       Final Clinical Impressions(s) / UC Diagnoses   Final diagnoses:  None    New Prescriptions New Prescriptions   No medications on file     Billy Fischer, MD 06/25/16 1743

## 2016-06-28 LAB — CULTURE, GROUP A STREP (THRC)

## 2016-07-16 ENCOUNTER — Encounter: Payer: Self-pay | Admitting: *Deleted

## 2016-07-16 NOTE — Progress Notes (Signed)
Letter sent via MyChart to return IFOB kit.  Order extended to 08/16/16.

## 2016-08-04 ENCOUNTER — Other Ambulatory Visit: Payer: 59

## 2016-08-04 ENCOUNTER — Telehealth: Payer: Self-pay | Admitting: Nurse Practitioner

## 2016-08-04 NOTE — Telephone Encounter (Signed)
Left message on voicemail to reschedule missed lab appointment. °

## 2016-08-05 NOTE — Telephone Encounter (Signed)
Please give pt a call to get labs rescheduled - high cholesterol and low Vit D.  Hopefully can have done with a 12 hour fasting.

## 2016-08-06 NOTE — Telephone Encounter (Signed)
Left detailed message on vm (per DPR) to advise patient to call the office to reschedule her lab appointment that she missed earlier this week. Also let patient know that she needs to be fasting at least 8-12 hours.

## 2016-08-12 NOTE — Telephone Encounter (Signed)
Left message (per DPR) for patient to call office and reschedule her missed lab appointment. Advised patient that Chong Sicilian would like her to be fasting, so nothing to eat or drink except water for 8-12 hours.

## 2016-08-14 NOTE — Telephone Encounter (Signed)
Ok to close

## 2016-08-14 NOTE — Telephone Encounter (Signed)
Patient has been called 3 times and messages left. Patient has not returned calls nor scheduled another lab appointment. Ok to close encounter? Please advise. Thank you.

## 2016-08-22 ENCOUNTER — Ambulatory Visit (HOSPITAL_COMMUNITY)
Admission: EM | Admit: 2016-08-22 | Discharge: 2016-08-22 | Disposition: A | Discharge status: Home / Self Care | Payer: BLUE CROSS/BLUE SHIELD | Attending: Internal Medicine | Admitting: Internal Medicine

## 2016-08-22 ENCOUNTER — Encounter (HOSPITAL_COMMUNITY): Payer: Self-pay | Admitting: *Deleted

## 2016-08-22 DIAGNOSIS — R05 Cough: Secondary | ICD-10-CM | POA: Diagnosis not present

## 2016-08-22 DIAGNOSIS — J9801 Acute bronchospasm: Secondary | ICD-10-CM | POA: Diagnosis not present

## 2016-08-22 DIAGNOSIS — R0982 Postnasal drip: Secondary | ICD-10-CM | POA: Diagnosis not present

## 2016-08-22 DIAGNOSIS — R059 Cough, unspecified: Secondary | ICD-10-CM

## 2016-08-22 DIAGNOSIS — J040 Acute laryngitis: Secondary | ICD-10-CM

## 2016-08-22 MED ORDER — ALBUTEROL SULFATE HFA 108 (90 BASE) MCG/ACT IN AERS: 2.0000 | INHALATION_SPRAY | RESPIRATORY_TRACT | 0 refills | Status: DC | PRN

## 2016-08-22 MED ORDER — PREDNISONE 50 MG PO TABS: ORAL_TABLET | ORAL | 0 refills | Status: DC

## 2016-08-22 MED ORDER — AZITHROMYCIN 250 MG PO TABS: 250.0000 mg | ORAL_TABLET | Freq: Every day | ORAL | 0 refills | Status: DC

## 2016-08-22 NOTE — Discharge Instructions (Signed)
Your cough is primarily due to accommodation of the drainage in the back of your throat and a faint bronchospasm or wheeze. Take the antihistamines as directed below, drink plenty of fluids and the prescription at occasions. Your hoarseness is likely due to the drainage which irritates the voice box and causes inflammation. Sudafed PE 10 mg every 4 to 6 hours as needed for congestion Allegra or Zyrtec daily as needed for drainage and runny nose. For stronger antihistamine may take Chlor-Trimeton 2 to 4 mg every 4 to 6 hours, may cause drowsiness. Saline nasal spray used frequently. Ibuprofen 600 mg every 6 hours as needed for pain, discomfort or fever unless you are taking steroids. Take Tylenol instead. Drink plenty of fluids and stay well-hydrated.

## 2016-08-22 NOTE — ED Triage Notes (Signed)
C/O cough, chest congestion and pain, sore throat x 3 days.  Unsure if fevers.  Pt leaving for cruise this week.  Has been taking OTC meds.

## 2016-08-22 NOTE — ED Provider Notes (Signed)
CSN: [id number]     Arrival date & time 08/22/16  1237 History   First MD Initiated Contact with Patient 08/22/16 1555     Chief Complaint  Patient presents with  . Cough   (Consider location/radiation/quality/duration/timing/severity/associated sxs/prior Treatment) 56 year old female complaining of anterior chest pain only with coughing. She is complaining of voice change, hoarseness and PND for 3 days. Uncertain about fevers at home, temperature here 99.3. Vital signs otherwise normal. She states the last time she got this several months ago she was given a Z-Pak after 3 weeks she was better. She is getting ready to go on a cruise and wants this "knocked out" before she goes on the cruise.      Past Medical History:  Diagnosis Date  . Hyperlipidemia   . Osteoarthritis    Past Surgical History:  Procedure Laterality Date  . ABLATION    . BREAST REDUCTION SURGERY    . CESAREAN SECTION    . LAPAROSCOPY    . PARTIAL KNEE ARTHROPLASTY Left 4/08  . REPLACEMENT TOTAL KNEE Left 1/10  . SALPINGECTOMY Left 8/06   secondary to cyst   Family History  Problem Relation Age of Onset  . Heart attack Mother   . Hypertension Mother   . Diabetes Sister    Social History  Substance Use Topics  . Smoking status: Never Smoker  . Smokeless tobacco: Never Used  . Alcohol use 1.8 - 3.6 oz/week    3 - 6 Glasses of wine per week     Comment: social   OB History    Gravida Para Term Preterm AB Living   2 2 2  0 0 2   SAB TAB Ectopic Multiple Live Births   0 0 0 0 2     Review of Systems  Constitutional: Positive for activity change and fever. Negative for appetite change, chills and fatigue.  HENT: Positive for congestion, postnasal drip, rhinorrhea and voice change. Negative for facial swelling.   Eyes: Negative.   Respiratory: Positive for cough. Negative for shortness of breath.   Cardiovascular: Negative.   Musculoskeletal: Negative for neck pain and neck stiffness.  Skin: Negative  for pallor and rash.  Neurological: Negative.     Allergies  Codeine and Hydrocodone  Home Medications   Prior to Admission medications   Medication Sig Start Date End Date Taking? Authorizing Provider  escitalopram (LEXAPRO) 10 MG tablet Take 1 tablet (10 mg total) by mouth daily. 05/04/16  Yes [name], FNP  Multiple Vitamins-Minerals (MULTIVITAMIN PO) Take by mouth as needed.   Yes Historical Provider, MD  Vitamin D, Ergocalciferol, (DRISDOL) 50000 units CAPS capsule Take 1 capsule (50,000 Units total) by mouth every 7 (seven) days. 05/04/16  Yes [name], FNP  Acetaminophen (TYLENOL PO) Take by mouth as needed.    Historical Provider, MD  albuterol (PROVENTIL HFA;VENTOLIN HFA) 108 (90 Base) MCG/ACT inhaler Inhale 2 puffs into the lungs every 4 (four) hours as needed for wheezing or shortness of breath. 08/22/16   Janne Napoleon, NP  azithromycin (ZITHROMAX) 250 MG tablet Take 1 tablet (250 mg total) by mouth daily. 2 tabs po on day one, then one tablet po once daily on days 2-5. 08/22/16   Janne Napoleon, NP  Ibuprofen (ADVIL PO) Take by mouth as needed.    Historical Provider, MD  predniSONE (DELTASONE) 50 MG tablet 1 tab po daily for 6 days. Take with food. 08/22/16   Janne Napoleon, NP   Meds Ordered and Administered this  Visit  Medications - No data to display  BP 140/81   Pulse 84   Temp 99.3 F (37.4 C) (Oral)   Resp 18   LMP 12/11/2013 (Approximate)   SpO2 98%  No data found.   Physical Exam  Constitutional: She is oriented to person, place, and time. She appears well-developed and well-nourished. No distress.  HENT:  Head: Normocephalic and atraumatic.  Right Ear: External ear normal.  Left Ear: External ear normal.  Bilateral TMs are normal. Oropharynx with faint spotty erythema and scant clear PND otherwise normal. No exudates.  Eyes: EOM are normal. Pupils are equal, round, and reactive to light.  Neck: Normal range of motion. Neck supple.  Cardiovascular: Normal  rate, regular rhythm and intact distal pulses.   Murmur heard. 2 or 6 systolic ejection murmur heard best at the right upper sternal border  Pulmonary/Chest: Effort normal and breath sounds normal. No respiratory distress.  Musculoskeletal: Normal range of motion. She exhibits no edema.  Lymphadenopathy:    She has no cervical adenopathy.  Neurological: She is alert and oriented to person, place, and time.  Skin: Skin is warm and dry.  Psychiatric: She has a normal mood and affect.  Nursing note and vitals reviewed.   Urgent Care Course     Procedures (including critical care time)  Labs Review Labs Reviewed - No data to display  Imaging Review No results found.   Visual Acuity Review  Right Eye Distance:   Left Eye Distance:   Bilateral Distance:    Right Eye Near:   Left Eye Near:    Bilateral Near:         MDM   1. Cough   2. PND (post-nasal drip)   3. Laryngitis   4. Bronchospasm    Your cough is primarily due to accommodation of the drainage in the back of your throat and a faint bronchospasm or wheeze. Take the antihistamines as directed below, drink plenty of fluids and the prescription at occasions. Your hoarseness is likely due to the drainage which irritates the voice box and causes inflammation. Sudafed PE 10 mg every 4 to 6 hours as needed for congestion Allegra or Zyrtec daily as needed for drainage and runny nose. For stronger antihistamine may take Chlor-Trimeton 2 to 4 mg every 4 to 6 hours, may cause drowsiness. Saline nasal spray used frequently. Ibuprofen 600 mg every 6 hours as needed for pain, discomfort or fever unless you are taking steroids. Take Tylenol instead. Drink plenty of fluids and stay well-hydrated. Meds ordered this encounter  Medications  . albuterol (PROVENTIL HFA;VENTOLIN HFA) 108 (90 Base) MCG/ACT inhaler    Sig: Inhale 2 puffs into the lungs every 4 (four) hours as needed for wheezing or shortness of breath.    Dispense:   1 Inhaler    Refill:  0    Order Specific Question:   Supervising Provider    Answer:   Sherlene Shams [id number]  . predniSONE (DELTASONE) 50 MG tablet    Sig: 1 tab po daily for 6 days. Take with food.    Dispense:  6 tablet    Refill:  0    Order Specific Question:   Supervising Provider    Answer:   Sherlene Shams [id number]  . azithromycin (ZITHROMAX) 250 MG tablet    Sig: Take 1 tablet (250 mg total) by mouth daily. 2 tabs po on day one, then one tablet po once daily on days 2-5.  Dispense:  6 tablet    Refill:  0    Order Specific Question:   Supervising Provider    Answer:   Sherlene Shams [id number]       Janne Napoleon, NP 08/22/16 [phone]

## 2016-09-09 ENCOUNTER — Telehealth: Payer: Self-pay | Admitting: *Deleted

## 2016-09-09 DIAGNOSIS — R05 Cough: Secondary | ICD-10-CM

## 2016-09-09 DIAGNOSIS — R053 Chronic cough: Secondary | ICD-10-CM

## 2016-09-09 NOTE — Telephone Encounter (Signed)
OK for TSH 

## 2016-09-09 NOTE — Telephone Encounter (Signed)
-----   Message from [name], [place] sent at 09/09/2016  8:03 AM EST ----- Please call pt to reschedule ----- Message ----- From: SYSTEM Sent: 09/09/2016  12:06 AM To: [name], FNP

## 2016-09-09 NOTE — Telephone Encounter (Signed)
Lab appointment scheduled for 09/29/16 for repeat Lipid and Vit D.    Patient states she has had a persistent cough since December and would like to have thyroid checked also.  Please advise if OK.  Lipid and Vit D order extended until 10/04/16.

## 2016-09-10 NOTE — Telephone Encounter (Signed)
TSH added to appointment notes.  Order entered.  Closing encounter.

## 2016-09-29 ENCOUNTER — Other Ambulatory Visit (INDEPENDENT_AMBULATORY_CARE_PROVIDER_SITE_OTHER): Payer: BLUE CROSS/BLUE SHIELD

## 2016-09-29 DIAGNOSIS — R05 Cough: Secondary | ICD-10-CM

## 2016-09-29 DIAGNOSIS — E559 Vitamin D deficiency, unspecified: Secondary | ICD-10-CM

## 2016-09-29 DIAGNOSIS — E782 Mixed hyperlipidemia: Secondary | ICD-10-CM

## 2016-09-29 DIAGNOSIS — R053 Chronic cough: Secondary | ICD-10-CM

## 2016-09-29 LAB — TSH: TSH: 1.74 m[IU]/L

## 2016-09-29 LAB — LIPID PANEL
CHOLESTEROL: 228 mg/dL — AB (ref <200)
HDL: 65 mg/dL (ref >50)
LDL Cholesterol: 137 mg/dL — ABNORMAL HIGH (ref <100)
Total CHOL/HDL Ratio: 3.5 Ratio (ref <5.0)
Triglycerides: 131 mg/dL (ref <150)
VLDL: 26 mg/dL (ref <30)

## 2016-09-30 ENCOUNTER — Other Ambulatory Visit: Payer: Self-pay | Admitting: Obstetrics & Gynecology

## 2016-09-30 DIAGNOSIS — Z1231 Encounter for screening mammogram for malignant neoplasm of breast: Secondary | ICD-10-CM

## 2016-09-30 LAB — VITAMIN D 25 HYDROXY (VIT D DEFICIENCY, FRACTURES): Vit D, 25-Hydroxy: 36 ng/mL (ref 30–100)

## 2016-11-02 ENCOUNTER — Ambulatory Visit
Admission: RE | Admit: 2016-11-02 | Discharge: 2016-11-02 | Disposition: A | Discharge status: Home / Self Care | Payer: BLUE CROSS/BLUE SHIELD | Source: Ambulatory Visit | Attending: Obstetrics & Gynecology | Admitting: Obstetrics & Gynecology

## 2016-11-02 DIAGNOSIS — Z1231 Encounter for screening mammogram for malignant neoplasm of breast: Secondary | ICD-10-CM

## 2017-02-01 ENCOUNTER — Telehealth: Payer: Self-pay | Admitting: Obstetrics and Gynecology

## 2017-02-01 NOTE — Telephone Encounter (Signed)
Left message regarding upcoming appointment has been canceled and needs to be rescheduled. °

## 2017-05-07 ENCOUNTER — Ambulatory Visit: Payer: 59 | Admitting: Nurse Practitioner

## 2017-08-02 ENCOUNTER — Other Ambulatory Visit: Payer: Self-pay | Admitting: *Deleted

## 2017-08-02 NOTE — Telephone Encounter (Signed)
Call to patient. Patient asking to call back to schedule aex.

## 2017-08-05 NOTE — Progress Notes (Signed)
57 y.o. [id number] Divorced CaucasianF here for annual exam.  No vaginal bleeding. Sexually active, same partner since April. No dyspareunia. No vasomotor symptoms.     Patient's last menstrual period was 12/11/2013 (approximate).          Sexually active: Yes.    The current method of family planning is post menopausal status.    Exercising: Yes.    pump, walking and cycling Smoker:  no  Health Maintenance: Pap: 05-04-16 Neg:Neg HR HPV,   History of abnormal Pap:  no MMG:  11-02-16 Density B/Neg/BiRads1 Colonoscopy: 2010 f/u 10 yrs - BMD:   n/a TDaP:  06-12-05 Gardasil: no   reports that  has never smoked. she has never used smokeless tobacco. She reports that she drinks about 1.8 - 3.6 oz of alcohol per week. She reports that she does not use drugs. 2 kids 39 and 92, son is 18, daughter is 72. Daughter is in college, son senior in [place]. She works in Press photographer, hospitality.   Past Medical History:  Diagnosis Date  . Hyperlipidemia   . Osteoarthritis     Past Surgical History:  Procedure Laterality Date  . ABLATION    . BREAST REDUCTION SURGERY    . CESAREAN SECTION    . LAPAROSCOPY    . PARTIAL KNEE ARTHROPLASTY Left 4/08  . REDUCTION MAMMAPLASTY Bilateral 2005  . REPLACEMENT TOTAL KNEE Left 1/10  . SALPINGECTOMY Left 8/06   secondary to cyst  C/S x 2  Current Outpatient Medications  Medication Sig Dispense Refill  . cetirizine (ZYRTEC) 10 MG tablet Take 10 mg by mouth daily.    Marland Kitchen escitalopram (LEXAPRO) 10 MG tablet Take 1 tablet (10 mg total) by mouth daily. 90 tablet 4  . lactobacillus acidophilus (BACID) TABS tablet Take 2 tablets by mouth daily.    . Multiple Vitamins-Minerals (MULTIVITAMIN PO) Take by mouth as needed.    . Omega-3 Fatty Acids (FISH OIL) 1000 MG CAPS Take 1 tablet by mouth daily.     No current facility-administered medications for this visit.     Family History  Problem Relation Age of Onset  . Heart attack Mother   . Hypertension Mother   . Diabetes  Mother   . Diabetes Sister     Review of Systems  Exam:   LMP 12/11/2013 (Approximate)   Weight change: @WEIGHTCHANGE @ Height:      Ht Readings from Last 3 Encounters:  05/04/16 5' 5.5" (1.664 m)  02/12/15 5' 5.75" (1.67 m)  02/01/14 5' 5.75" (1.67 m)    General appearance: alert, cooperative and appears stated age Head: Normocephalic, without obvious abnormality, atraumatic Neck: no adenopathy, supple, symmetrical, trachea midline and thyroid normal to inspection and palpation Lungs: clear to auscultation bilaterally Cardiovascular: regular rate and rhythm Breasts: normal appearance, no masses or tenderness Abdomen: soft, non-tender; non distended,  no masses,  no organomegaly Extremities: extremities normal, atraumatic, no cyanosis or edema Skin: Skin color, texture, turgor normal. No rashes or lesions Lymph nodes: Cervical, supraclavicular, and axillary nodes normal. No abnormal inguinal nodes palpated Neurologic: Grossly normal   Pelvic: External genitalia:  no lesions              Urethra:  normal appearing urethra with no masses, tenderness or lesions              Bartholins and Skenes: normal                 Vagina: normal appearing vagina with normal color and  discharge, no lesions              Cervix: no lesions               Bimanual Exam:  Uterus:  normal size, contour, position, consistency, mobility, non-tender              Adnexa: no mass, fullness, tenderness               Rectovaginal: Confirms               Anus:  normal sphincter tone, no lesions  Chaperone was present for exam.  A:  Well Woman with normal exam  Anxiety, controlled on Lexapro  H/O vit d def, on vit d  P:   No pap this year  Mammogram 4/19  Colonoscopy UTD  Continue Lexapro  Discussed breast self exam  Discussed calcium and vit D intake  Labs with primary MD

## 2017-08-06 ENCOUNTER — Encounter: Payer: Self-pay | Admitting: Obstetrics and Gynecology

## 2017-08-06 ENCOUNTER — Other Ambulatory Visit: Payer: Self-pay

## 2017-08-06 ENCOUNTER — Ambulatory Visit: Payer: BLUE CROSS/BLUE SHIELD | Admitting: Obstetrics and Gynecology

## 2017-08-06 DIAGNOSIS — E559 Vitamin D deficiency, unspecified: Secondary | ICD-10-CM | POA: Diagnosis not present

## 2017-08-06 DIAGNOSIS — Z01419 Encounter for gynecological examination (general) (routine) without abnormal findings: Secondary | ICD-10-CM

## 2017-08-06 DIAGNOSIS — F419 Anxiety disorder, unspecified: Secondary | ICD-10-CM

## 2017-08-06 MED ORDER — ESCITALOPRAM OXALATE 10 MG PO TABS: 10.0000 mg | ORAL_TABLET | Freq: Every day | ORAL | 4 refills | Status: DC

## 2017-08-06 NOTE — Patient Instructions (Signed)
EXERCISE AND DIET:  We recommended that you start or continue a regular exercise program for good health. Regular exercise means any activity that makes your heart beat faster and makes you sweat.  We recommend exercising at least 30 minutes per day at least 3 days a week, preferably 4 or 5.  We also recommend a diet low in fat and sugar.  Inactivity, poor dietary choices and obesity can cause diabetes, heart attack, stroke, and kidney damage, among others.    ALCOHOL AND SMOKING:  Women should limit their alcohol intake to no more than 7 drinks/beers/glasses of wine (combined, not each!) per week. Moderation of alcohol intake to this level decreases your risk of breast cancer and liver damage. And of course, no recreational drugs are part of a healthy lifestyle.  And absolutely no smoking or even second hand smoke. Most people know smoking can cause heart and lung diseases, but did you know it also contributes to weakening of your bones? Aging of your skin?  Yellowing of your teeth and nails?  CALCIUM AND VITAMIN D:  Adequate intake of calcium and Vitamin D are recommended.  The recommendations for exact amounts of these supplements seem to change often, but generally speaking 600 mg of calcium (either carbonate or citrate) and 800 units of Vitamin D per day seems prudent. Certain women may benefit from higher intake of Vitamin D.  If you are among these women, your doctor will have told you during your visit.    PAP SMEARS:  Pap smears, to check for cervical cancer or precancers,  have traditionally been done yearly, although recent scientific advances have shown that most women can have pap smears less often.  However, every woman still should have a physical exam from her gynecologist every year. It will include a breast check, inspection of the vulva and vagina to check for abnormal growths or skin changes, a visual exam of the cervix, and then an exam to evaluate the size and shape of the uterus and  ovaries.  And after 57 years of age, a rectal exam is indicated to check for rectal cancers. We will also provide age appropriate advice regarding health maintenance, like when you should have certain vaccines, screening for sexually transmitted diseases, bone density testing, colonoscopy, mammograms, etc.   MAMMOGRAMS:  All women over 40 years old should have a yearly mammogram. Many facilities now offer a "3D" mammogram, which may cost around $50 extra out of pocket. If possible,  we recommend you accept the option to have the 3D mammogram performed.  It both reduces the number of women who will be called back for extra views which then turn out to be normal, and it is better than the routine mammogram at detecting truly abnormal areas.    COLONOSCOPY:  Colonoscopy to screen for colon cancer is recommended for all women at age 50.  We know, you hate the idea of the prep.  We agree, BUT, having colon cancer and not knowing it is worse!!  Colon cancer so often starts as a polyp that can be seen and removed at colonscopy, which can quite literally save your life!  And if your first colonoscopy is normal and you have no family history of colon cancer, most women don't have to have it again for 10 years.  Once every ten years, you can do something that may end up saving your life, right?  We will be happy to help you get it scheduled when you are ready.    Be sure to check your insurance coverage so you understand how much it will cost.  It may be covered as a preventative service at no cost, but you should check your particular policy.      Breast Self-Awareness Breast self-awareness means being familiar with how your breasts look and feel. It involves checking your breasts regularly and reporting any changes to your health care provider. Practicing breast self-awareness is important. A change in your breasts can be a sign of a serious medical problem. Being familiar with how your breasts look and feel allows  you to find any problems early, when treatment is more likely to be successful. All women should practice breast self-awareness, including women who have had breast implants. How to do a breast self-exam One way to learn what is normal for your breasts and whether your breasts are changing is to do a breast self-exam. To do a breast self-exam: Look for Changes  1. Remove all the clothing above your waist. 2. Stand in front of a mirror in a room with good lighting. 3. Put your hands on your hips. 4. Push your hands firmly downward. 5. Compare your breasts in the mirror. Look for differences between them (asymmetry), such as: ? Differences in shape. ? Differences in size. ? Puckers, dips, and bumps in one breast and not the other. 6. Look at each breast for changes in your skin, such as: ? Redness. ? Scaly areas. 7. Look for changes in your nipples, such as: ? Discharge. ? Bleeding. ? Dimpling. ? Redness. ? A change in position. Feel for Changes  Carefully feel your breasts for lumps and changes. It is best to do this while lying on your back on the floor and again while sitting or standing in the shower or tub with soapy water on your skin. Feel each breast in the following way:  Place the arm on the side of the breast you are examining above your head.  Feel your breast with the other hand.  Start in the nipple area and make  inch (2 cm) overlapping circles to feel your breast. Use the pads of your three middle fingers to do this. Apply light pressure, then medium pressure, then firm pressure. The light pressure will allow you to feel the tissue closest to the skin. The medium pressure will allow you to feel the tissue that is a little deeper. The firm pressure will allow you to feel the tissue close to the ribs.  Continue the overlapping circles, moving downward over the breast until you feel your ribs below your breast.  Move one finger-width toward the center of the body.  Continue to use the  inch (2 cm) overlapping circles to feel your breast as you move slowly up toward your collarbone.  Continue the up and down exam using all three pressures until you reach your armpit.  Write Down What You Find  Write down what is normal for each breast and any changes that you find. Keep a written record with breast changes or normal findings for each breast. By writing this information down, you do not need to depend only on memory for size, tenderness, or location. Write down where you are in your menstrual cycle, if you are still menstruating. If you are having trouble noticing differences in your breasts, do not get discouraged. With time you will become more familiar with the variations in your breasts and more comfortable with the exam. How often should I examine my breasts? Examine   your breasts every month. If you are breastfeeding, the best time to examine your breasts is after a feeding or after using a breast pump. If you menstruate, the best time to examine your breasts is 5-7 days after your period is over. During your period, your breasts are lumpier, and it may be more difficult to notice changes. When should I see my health care provider? See your health care provider if you notice:  A change in shape or size of your breasts or nipples.  A change in the skin of your breast or nipples, such as a reddened or scaly area.  Unusual discharge from your nipples.  A lump or thick area that was not there before.  Pain in your breasts.  Anything that concerns you.  This information is not intended to replace advice given to you by your health care provider. Make sure you discuss any questions you have with your health care provider. Document Released: 06/29/2005 Document Revised: 12/05/2015 Document Reviewed: 05/19/2015 Elsevier Interactive Patient Education  2018 Elsevier Inc.  

## 2017-08-06 NOTE — Telephone Encounter (Signed)
Patient seen by Dr. Talbert Nan on 08/06/17 for AEX and refill sent.   Encounter closed.

## 2017-10-01 ENCOUNTER — Other Ambulatory Visit: Payer: Self-pay | Admitting: Obstetrics & Gynecology

## 2017-10-01 DIAGNOSIS — Z1231 Encounter for screening mammogram for malignant neoplasm of breast: Secondary | ICD-10-CM

## 2017-11-09 ENCOUNTER — Ambulatory Visit
Admission: RE | Admit: 2017-11-09 | Discharge: 2017-11-09 | Disposition: A | Discharge status: Home / Self Care | Payer: BLUE CROSS/BLUE SHIELD | Source: Ambulatory Visit | Attending: Obstetrics & Gynecology | Admitting: Obstetrics & Gynecology

## 2017-11-09 DIAGNOSIS — Z1231 Encounter for screening mammogram for malignant neoplasm of breast: Secondary | ICD-10-CM

## 2018-08-09 ENCOUNTER — Other Ambulatory Visit: Payer: Self-pay | Admitting: Obstetrics and Gynecology

## 2018-08-09 DIAGNOSIS — F419 Anxiety disorder, unspecified: Secondary | ICD-10-CM

## 2018-08-09 NOTE — Telephone Encounter (Signed)
Medication refill request: Lexapro  Last AEX:  08/06/17 Next AEX: 09/01/18 Last MMG (if hormonal medication request): 11/09/17  Bi- Rads 1 Neg Refill authorized: #[id number] with 0 RF

## 2018-08-31 NOTE — Progress Notes (Signed)
58 y.o. [id number] Divorced White or Caucasian Not Hispanic or Latino female here for annual exam.  Single and loving it.  No vaginal bleeding. No bowel or bladder issues.     Patient's last menstrual period was 12/11/2013 (approximate).          Sexually active: No.  The current method of family planning is tubal ligation.    Exercising: Yes.    walking, pump Smoker:  no  Health Maintenance: Pap: 05-04-16 Neg:Neg HR HPV,   History of abnormal Pap:  no MMG:  11/09/2017 Birads 1 negative Colonoscopy: 2010 f/u 10 yrs, due this year BMD:   N/A TDaP:  06-12-05 Gardasil: No    reports that she has never smoked. She has never used smokeless tobacco. She reports current alcohol use of about 1.0 - 5.0 standard drinks of alcohol per week. She reports that she does not use drugs. She works in Press photographer, hospitality. She is traveling a lot, having fun with her girlfriends. Son is 70 freshman at [employer]. Daughter is 47, just graduated from college, degree in [place], moving to [place], [place].    Past Medical History:  Diagnosis Date  . Hyperlipidemia   . Osteoarthritis   . Vitamin D deficiency     Past Surgical History:  Procedure Laterality Date  . ABLATION    . BREAST REDUCTION SURGERY    . CESAREAN SECTION    . LAPAROSCOPY    . PARTIAL KNEE ARTHROPLASTY Left 4/08  . REDUCTION MAMMAPLASTY Bilateral 2005  . REPLACEMENT TOTAL KNEE Left 1/10  . SALPINGECTOMY Left 8/06   secondary to cyst    Current Outpatient Medications  Medication Sig Dispense Refill  . cetirizine (ZYRTEC) 10 MG tablet Take 10 mg by mouth daily.    Marland Kitchen escitalopram (LEXAPRO) 10 MG tablet TAKE 1 TABLET BY MOUTH EVERY DAY 30 tablet 0  . lactobacillus acidophilus (BACID) TABS tablet Take 2 tablets by mouth daily.    . Multiple Vitamins-Minerals (MULTIVITAMIN PO) Take by mouth as needed.    Marland Kitchen VITAMIN D PO Take 2 capsules by mouth daily.     No current facility-administered medications for this visit.     Family History   Problem Relation Age of Onset  . Heart attack Mother   . Hypertension Mother   . Diabetes Mother   . Diabetes Sister     Review of Systems  Constitutional: Negative.   HENT: Negative.   Eyes: Negative.   Respiratory: Negative.   Cardiovascular: Negative.   Gastrointestinal: Negative.   Endocrine: Negative.   Genitourinary: Negative.   Musculoskeletal: Negative.   Skin: Negative.   Allergic/Immunologic: Negative.   Neurological: Negative.   Hematological: Negative.   Psychiatric/Behavioral: Negative.     Exam:   BP (!) 142/82 (BP Location: Right Arm, Patient Position: Sitting, Cuff Size: Large)   Pulse 68   Ht 5' 5.75" (1.67 m)   Wt 216 lb 9.6 oz (98.2 kg)   LMP 12/11/2013 (Approximate)   BMI 35.23 kg/m   Weight change: @WEIGHTCHANGE @ Height:   Height: 5' 5.75" (167 cm)  Ht Readings from Last 3 Encounters:  09/01/18 5' 5.75" (1.67 m)  05/04/16 5' 5.5" (1.664 m)  02/12/15 5' 5.75" (1.67 m)    General appearance: alert, cooperative and appears stated age Head: Normocephalic, without obvious abnormality, atraumatic Neck: no adenopathy, supple, symmetrical, trachea midline and thyroid normal to inspection and palpation Lungs: clear to auscultation bilaterally Cardiovascular: regular rate and rhythm Breasts: normal appearance, no masses or tenderness, evidence  of bilateral breast reduction Abdomen: soft, non-tender; non distended,  no masses,  no organomegaly Extremities: extremities normal, atraumatic, no cyanosis or edema Skin: Skin color, texture, turgor normal. No rashes or lesions Lymph nodes: Cervical, supraclavicular, and axillary nodes normal. No abnormal inguinal nodes palpated Neurologic: Grossly normal   Pelvic: External genitalia:  no lesions              Urethra:  normal appearing urethra with no masses, tenderness or lesions              Bartholins and Skenes: normal                 Vagina: mildly atrophic appearing vagina with normal color and  discharge, no lesions              Cervix: no lesions               Bimanual Exam:  Uterus:  normal size, contour, position, consistency, mobility, non-tender              Adnexa: no mass, fullness, tenderness               Rectovaginal: Confirms               Anus:  normal sphincter tone, no lesions  Chaperone was present for exam.  A:  Well Woman with normal exam  H/O vit d def  FH of DM  BMI 35  P:   Pap next year  Colonoscopy referral placed  Mammogram 4/20  Screening labs, TSH, HgbA1C and vit D  Discussed breast self exam  Discussed calcium and vit D intake

## 2018-09-01 ENCOUNTER — Encounter: Payer: Self-pay | Admitting: Obstetrics and Gynecology

## 2018-09-01 ENCOUNTER — Other Ambulatory Visit: Payer: Self-pay

## 2018-09-01 ENCOUNTER — Ambulatory Visit: Payer: BLUE CROSS/BLUE SHIELD | Admitting: Obstetrics and Gynecology

## 2018-09-01 VITALS — BP 142/82 | HR 68 | Ht 65.75 in | Wt 216.6 lb

## 2018-09-01 DIAGNOSIS — E559 Vitamin D deficiency, unspecified: Secondary | ICD-10-CM | POA: Diagnosis not present

## 2018-09-01 DIAGNOSIS — Z0000 Encounter for general adult medical examination without abnormal findings: Secondary | ICD-10-CM

## 2018-09-01 DIAGNOSIS — Z833 Family history of diabetes mellitus: Secondary | ICD-10-CM

## 2018-09-01 DIAGNOSIS — Z01419 Encounter for gynecological examination (general) (routine) without abnormal findings: Secondary | ICD-10-CM

## 2018-09-01 DIAGNOSIS — Z23 Encounter for immunization: Secondary | ICD-10-CM

## 2018-09-01 DIAGNOSIS — Z6835 Body mass index (BMI) 35.0-35.9, adult: Secondary | ICD-10-CM

## 2018-09-01 DIAGNOSIS — Z1211 Encounter for screening for malignant neoplasm of colon: Secondary | ICD-10-CM

## 2018-09-01 DIAGNOSIS — F419 Anxiety disorder, unspecified: Secondary | ICD-10-CM

## 2018-09-01 MED ORDER — ESCITALOPRAM OXALATE 10 MG PO TABS: 10.0000 mg | ORAL_TABLET | Freq: Every day | ORAL | 3 refills | Status: DC

## 2018-09-01 NOTE — Patient Instructions (Signed)

## 2018-09-02 ENCOUNTER — Other Ambulatory Visit: Payer: Self-pay | Admitting: *Deleted

## 2018-09-02 DIAGNOSIS — R7303 Prediabetes: Secondary | ICD-10-CM

## 2018-09-02 LAB — COMPREHENSIVE METABOLIC PANEL
A/G RATIO: 1.6 (ref 1.2–2.2)
ALT: 22 IU/L (ref 0–32)
AST: 24 IU/L (ref 0–40)
Albumin: 4.4 g/dL (ref 3.8–4.9)
Alkaline Phosphatase: 93 IU/L (ref 39–117)
BILIRUBIN TOTAL: 0.2 mg/dL (ref 0.0–1.2)
BUN/Creatinine Ratio: 11 (ref 9–23)
BUN: 8 mg/dL (ref 6–24)
CHLORIDE: 101 mmol/L (ref 96–106)
CO2: 24 mmol/L (ref 20–29)
Calcium: 9.6 mg/dL (ref 8.7–10.2)
Creatinine, Ser: 0.7 mg/dL (ref 0.57–1.00)
GFR calc Af Amer: 111 mL/min/{1.73_m2} (ref >59)
GFR calc non Af Amer: 96 mL/min/{1.73_m2} (ref >59)
Globulin, Total: 2.7 g/dL (ref 1.5–4.5)
Glucose: 93 mg/dL (ref 65–99)
POTASSIUM: 4.2 mmol/L (ref 3.5–5.2)
Sodium: 140 mmol/L (ref 134–144)
TOTAL PROTEIN: 7.1 g/dL (ref 6.0–8.5)

## 2018-09-02 LAB — CBC
Hematocrit: 39.5 % (ref 34.0–46.6)
Hemoglobin: 13.1 g/dL (ref 11.1–15.9)
MCH: 29.7 pg (ref 26.6–33.0)
MCHC: 33.2 g/dL (ref 31.5–35.7)
MCV: 90 fL (ref 79–97)
PLATELETS: 322 10*3/uL (ref 150–450)
RBC: 4.41 x10E6/uL (ref 3.77–5.28)
RDW: 13.5 % (ref 11.7–15.4)
WBC: 4.7 10*3/uL (ref 3.4–10.8)

## 2018-09-02 LAB — LIPID PANEL
CHOLESTEROL TOTAL: 253 mg/dL — AB (ref 100–199)
Chol/HDL Ratio: 3.7 ratio (ref 0.0–4.4)
HDL: 69 mg/dL (ref >39)
LDL Calculated: 154 mg/dL — ABNORMAL HIGH (ref 0–99)
TRIGLYCERIDES: 149 mg/dL (ref 0–149)
VLDL Cholesterol Cal: 30 mg/dL (ref 5–40)

## 2018-09-02 LAB — VITAMIN D 25 HYDROXY (VIT D DEFICIENCY, FRACTURES): Vit D, 25-Hydroxy: 40.1 ng/mL (ref 30.0–100.0)

## 2018-09-02 LAB — HEMOGLOBIN A1C
ESTIMATED AVERAGE GLUCOSE: 123 mg/dL
HEMOGLOBIN A1C: 5.9 % — AB (ref 4.8–5.6)

## 2018-09-02 LAB — TSH: TSH: 1.81 u[IU]/mL (ref 0.450–4.500)

## 2018-12-02 ENCOUNTER — Other Ambulatory Visit: Payer: Self-pay | Admitting: Obstetrics & Gynecology

## 2018-12-02 DIAGNOSIS — Z1231 Encounter for screening mammogram for malignant neoplasm of breast: Secondary | ICD-10-CM

## 2018-12-08 ENCOUNTER — Encounter: Payer: Self-pay | Admitting: Obstetrics and Gynecology

## 2018-12-31 ENCOUNTER — Ambulatory Visit
Admission: RE | Admit: 2018-12-31 | Discharge: 2018-12-31 | Disposition: A | Discharge status: Home / Self Care | Payer: BLUE CROSS/BLUE SHIELD | Source: Ambulatory Visit | Attending: Obstetrics & Gynecology | Admitting: Obstetrics & Gynecology

## 2018-12-31 ENCOUNTER — Other Ambulatory Visit: Payer: Self-pay

## 2018-12-31 DIAGNOSIS — Z1231 Encounter for screening mammogram for malignant neoplasm of breast: Secondary | ICD-10-CM

## 2019-05-17 ENCOUNTER — Other Ambulatory Visit: Payer: Self-pay | Admitting: Obstetrics and Gynecology

## 2019-05-17 DIAGNOSIS — F419 Anxiety disorder, unspecified: Secondary | ICD-10-CM

## 2019-05-17 NOTE — Telephone Encounter (Signed)
Medication refill request: lexapro 10mg  Last AEX:  09-01-2018 Next AEX: 09-07-2019 Last MMG (if hormonal medication request): n/a Refill authorized: please approve until aex if appropriate.

## 2019-06-13 DIAGNOSIS — K5792 Diverticulitis of intestine, part unspecified, without perforation or abscess without bleeding: Secondary | ICD-10-CM

## 2019-06-13 HISTORY — DX: Diverticulitis of intestine, part unspecified, without perforation or abscess without bleeding: K57.92

## 2019-07-05 ENCOUNTER — Other Ambulatory Visit: Payer: Self-pay | Admitting: Physician Assistant

## 2019-07-05 DIAGNOSIS — E2839 Other primary ovarian failure: Secondary | ICD-10-CM

## 2019-07-18 ENCOUNTER — Other Ambulatory Visit: Payer: Self-pay | Admitting: Obstetrics and Gynecology

## 2019-07-18 DIAGNOSIS — F419 Anxiety disorder, unspecified: Secondary | ICD-10-CM

## 2019-07-18 NOTE — Telephone Encounter (Signed)
Medication refill request: Lexapro  Last AEX: 09/01/18   Next AEX:09/07/19 Last MMG (if hormonal medication request): NA Refill authorized: #90

## 2019-07-27 DIAGNOSIS — C641 Malignant neoplasm of right kidney, except renal pelvis: Secondary | ICD-10-CM | POA: Insufficient documentation

## 2019-07-27 DIAGNOSIS — N2889 Other specified disorders of kidney and ureter: Secondary | ICD-10-CM | POA: Insufficient documentation

## 2019-07-27 DIAGNOSIS — K5792 Diverticulitis of intestine, part unspecified, without perforation or abscess without bleeding: Secondary | ICD-10-CM | POA: Insufficient documentation

## 2019-09-04 NOTE — Progress Notes (Signed)
59 y.o. G2P2002 Divorced White or Caucasian Not Hispanic or Latino female here for annual exam.  Patient states that on 06/29/19 she was diagnosed with a small renal mass that she is going to have surgically removed the end of March. The mass was diagnosed when she presented to the ER with diverticulitis.  No vaginal bleeding. Not sexually active.   She has lost 10 lbs since her last visit.     Patient's last menstrual period was 12/11/2013 (approximate).          Sexually active: No.  The current method of family planning is post menopausal status.    Exercising: Yes.    Swimming, Pump and Walking  Smoker:  no  Health Maintenance: Pap:  05-04-16 Neg:Neg HR HPV 02/01/14 normal  History of abnormal Pap:  no MMG:  12/31/18 density B Birads 1 neg   BMD:   Never will get one this year with mammogram  Colonoscopy2010 f/u 10 yrs:  Has one scheduled for September 20, 2019 TDaP:  09/01/18 Gardasil: NA   reports that she has never smoked. She has never used smokeless tobacco. She reports current alcohol use of about 1.0 - 5.0 standard drinks of alcohol per week. She reports that she does not use drugs. She worked in Press photographer, hospitality. Laid off from work. Son is 96, sophomore at [employer]. Daughter is 69, works in [place], [place]  Past Medical History:  Diagnosis Date  . Diverticulitis 06/2019  . Hyperlipidemia   . Osteoarthritis   . Prediabetes   . Vitamin D deficiency     Past Surgical History:  Procedure Laterality Date  . ABLATION    . BREAST REDUCTION SURGERY    . CESAREAN SECTION    . LAPAROSCOPY    . PARTIAL KNEE ARTHROPLASTY Left 4/08  . REDUCTION MAMMAPLASTY Bilateral 2005  . REPLACEMENT TOTAL KNEE Left 1/10  . SALPINGECTOMY Left 8/06   secondary to cyst    Current Outpatient Medications  Medication Sig Dispense Refill  . cetirizine (ZYRTEC) 10 MG tablet Take 10 mg by mouth daily.    Marland Kitchen escitalopram (LEXAPRO) 10 MG tablet TAKE 1 TABLET BY MOUTH EVERY DAY 90 tablet 0  .  glucosamine-chondroitin 500-400 MG tablet Take by mouth.    . Multiple Vitamins-Minerals (MULTIVITAMIN PO) Take by mouth as needed.    Marland Kitchen VITAMIN D PO Take 2 capsules by mouth daily.     No current facility-administered medications for this visit.    Family History  Problem Relation Age of Onset  . Heart attack Mother   . Hypertension Mother   . Diabetes Mother   . Diabetes Sister   Mom died with CHF in 05-25-2023.   Review of Systems  All other systems reviewed and are negative.   Exam:   BP 140/80   Pulse 86   Temp (!) 97 F (36.1 C)   Ht 5' 5.5" (1.664 m)   Wt 206 lb (93.4 kg)   LMP 12/11/2013 (Approximate)   SpO2 96%   BMI 33.76 kg/m   Weight change: @WEIGHTCHANGE @ Height:   Height: 5' 5.5" (166.4 cm)  Ht Readings from Last 3 Encounters:  09/07/19 5' 5.5" (1.664 m)  09/01/18 5' 5.75" (1.67 m)  05/04/16 5' 5.5" (1.664 m)    General appearance: alert, cooperative and appears stated age Head: Normocephalic, without obvious abnormality, atraumatic Neck: no adenopathy, supple, symmetrical, trachea midline and thyroid normal to inspection and palpation Lungs: clear to auscultation bilaterally Cardiovascular: regular rate and rhythm Breasts:  normal appearance, no masses or tenderness Abdomen: soft, non-tender; non distended,  no masses,  no organomegaly Extremities: extremities normal, atraumatic, no cyanosis or edema Skin: Skin color, texture, turgor normal. No rashes or lesions Lymph nodes: Cervical, supraclavicular, and axillary nodes normal. No abnormal inguinal nodes palpated Neurologic: Grossly normal   Pelvic: External genitalia:  no lesions              Urethra:  normal appearing urethra with no masses, tenderness or lesions              Bartholins and Skenes: normal                 Vagina: normal appearing vagina with normal color and discharge, no lesions              Cervix: no lesions               Bimanual Exam:  Uterus:  normal size, contour, position,  consistency, mobility, non-tender              Adnexa: no mass, fullness, tenderness               Rectovaginal: declined  Gae Dry, chaperoned for the exam.  A:  Well Woman with normal exam  Elevated BP without diagnosis of HTN    P:   No pap this year  Labs with primary   Colonoscopy scheduled  Mammogram in 6/21  Recheck BP, if high will f/u with primary  Discussed breast self exam  Discussed calcium and vit D intake  Refill lexapro  Addendum: patient requests a script for latisse for her eyelashes, script sent.   Repeat BP: 132/68

## 2019-09-07 ENCOUNTER — Encounter: Payer: Self-pay | Admitting: Obstetrics and Gynecology

## 2019-09-07 ENCOUNTER — Ambulatory Visit: Payer: PRIVATE HEALTH INSURANCE | Admitting: Obstetrics and Gynecology

## 2019-09-07 ENCOUNTER — Other Ambulatory Visit: Payer: Self-pay

## 2019-09-07 VITALS — BP 140/80 | HR 86 | Temp 97.0°F | Ht 65.5 in | Wt 206.0 lb

## 2019-09-07 DIAGNOSIS — R7303 Prediabetes: Secondary | ICD-10-CM | POA: Insufficient documentation

## 2019-09-07 DIAGNOSIS — F419 Anxiety disorder, unspecified: Secondary | ICD-10-CM

## 2019-09-07 DIAGNOSIS — R030 Elevated blood-pressure reading, without diagnosis of hypertension: Secondary | ICD-10-CM | POA: Diagnosis not present

## 2019-09-07 DIAGNOSIS — Z01419 Encounter for gynecological examination (general) (routine) without abnormal findings: Secondary | ICD-10-CM | POA: Diagnosis not present

## 2019-09-07 MED ORDER — BIMATOPROST 0.03 % EX SOLN: CUTANEOUS | 12 refills | Status: DC

## 2019-09-07 MED ORDER — ESCITALOPRAM OXALATE 10 MG PO TABS: 10.0000 mg | ORAL_TABLET | Freq: Every day | ORAL | 3 refills | Status: DC

## 2019-09-07 NOTE — Patient Instructions (Signed)

## 2019-11-11 ENCOUNTER — Other Ambulatory Visit: Payer: Self-pay | Admitting: Obstetrics and Gynecology

## 2019-11-11 DIAGNOSIS — F419 Anxiety disorder, unspecified: Secondary | ICD-10-CM

## 2019-11-11 DIAGNOSIS — Z8659 Personal history of other mental and behavioral disorders: Secondary | ICD-10-CM

## 2019-11-17 DIAGNOSIS — F411 Generalized anxiety disorder: Secondary | ICD-10-CM | POA: Insufficient documentation

## 2019-11-17 DIAGNOSIS — Z8659 Personal history of other mental and behavioral disorders: Secondary | ICD-10-CM | POA: Insufficient documentation

## 2019-11-17 NOTE — Telephone Encounter (Signed)
Medication refill request: lexapro 10mg  Last AEX:  09-07-2019 Next AEX: 09-12-2020 Last MMG (if hormonal medication request): n/a Refill authorized: rx was sent 09-07-19 for 90 days. Request came in & says its for 90 day supply & also needs dx code. Please approve & add dx code if appropriate

## 2019-12-04 ENCOUNTER — Other Ambulatory Visit: Payer: Self-pay | Admitting: Obstetrics & Gynecology

## 2019-12-04 DIAGNOSIS — Z1231 Encounter for screening mammogram for malignant neoplasm of breast: Secondary | ICD-10-CM

## 2019-12-12 ENCOUNTER — Other Ambulatory Visit: Payer: Self-pay

## 2019-12-12 NOTE — Progress Notes (Signed)
Documented 2 covid vaccine.

## 2020-01-01 ENCOUNTER — Ambulatory Visit
Admission: RE | Admit: 2020-01-01 | Discharge: 2020-01-01 | Disposition: A | Discharge status: Home / Self Care | Payer: BC Managed Care – PPO | Source: Ambulatory Visit | Attending: Obstetrics & Gynecology | Admitting: Obstetrics & Gynecology

## 2020-01-01 ENCOUNTER — Other Ambulatory Visit: Payer: Self-pay

## 2020-01-01 DIAGNOSIS — Z1231 Encounter for screening mammogram for malignant neoplasm of breast: Secondary | ICD-10-CM

## 2020-07-27 ENCOUNTER — Other Ambulatory Visit: Payer: Self-pay | Admitting: Physician Assistant

## 2020-07-27 DIAGNOSIS — E2839 Other primary ovarian failure: Secondary | ICD-10-CM

## 2020-08-08 ENCOUNTER — Other Ambulatory Visit: Payer: Self-pay | Admitting: Obstetrics & Gynecology

## 2020-08-08 DIAGNOSIS — Z1231 Encounter for screening mammogram for malignant neoplasm of breast: Secondary | ICD-10-CM

## 2020-09-12 ENCOUNTER — Ambulatory Visit: Payer: Self-pay | Admitting: Obstetrics and Gynecology

## 2020-09-27 ENCOUNTER — Other Ambulatory Visit: Payer: Self-pay | Admitting: Obstetrics and Gynecology

## 2020-09-27 DIAGNOSIS — F419 Anxiety disorder, unspecified: Secondary | ICD-10-CM

## 2020-09-27 NOTE — Telephone Encounter (Signed)
Med refill request received from pharmacy for Lexapro 10 mg tab.  Last Rx sent on 11/17/19 #90/4RF  Last AEX 09/07/19 Next AEX 11/22/20  Call to patient to confirm if Rx is needed. Left message to call triage at [facility], [phone].

## 2020-09-30 NOTE — Telephone Encounter (Signed)
Patient called. She does need refill on Lexapro 10mg . She asked for at least enough to take her to her AEX scheduled on 11/22/20.

## 2020-10-01 NOTE — Telephone Encounter (Signed)
Per DPR access note on file I left message in patient's voice mail that Rx has been sent to pharmacy.

## 2020-11-22 ENCOUNTER — Ambulatory Visit: Payer: Self-pay | Admitting: Obstetrics and Gynecology

## 2020-12-11 NOTE — Progress Notes (Deleted)
60 y.o. G41P2002 Divorced White or Caucasian Not Hispanic or Latino female here for annual exam.      Patient's last menstrual period was 12/11/2013 (approximate).          Sexually active: {yes no:314532}  The current method of family planning is {contraception:315051}.    Exercising: {yes no:314532}  {types:19826} Smoker:  {YES NO:22349}  Health Maintenance: Pap:  05-04-16 Neg:Neg HR HPV 02/01/14 normal  History of abnormal Pap:  {YES NO:22349} MMG:  01/03/20 density B Bi-rads 1 neg  BMD:   Has scheduled  Colonoscopy: *** TDaP:  09/01/18 Gardasil: NA   reports that she has never smoked. She has never used smokeless tobacco. She reports current alcohol use of about 1.0 - 5.0 standard drink of alcohol per week. She reports that she does not use drugs.  Past Medical History:  Diagnosis Date  . Diverticulitis 06/2019  . Hyperlipidemia   . Osteoarthritis   . Prediabetes   . Vitamin D deficiency     Past Surgical History:  Procedure Laterality Date  . ABLATION    . BREAST REDUCTION SURGERY    . CESAREAN SECTION    . LAPAROSCOPY    . PARTIAL KNEE ARTHROPLASTY Left 4/08  . REDUCTION MAMMAPLASTY Bilateral 2005  . REPLACEMENT TOTAL KNEE Left 1/10  . SALPINGECTOMY Left 8/06   secondary to cyst    Current Outpatient Medications  Medication Sig Dispense Refill  . bimatoprost (LATISSE) 0.03 % ophthalmic solution Place one drop on applicator and apply evenly along the skin of the upper eyelid at base of eyelashes once daily at bedtime; repeat procedure for second eye (use a clean applicator). 5 mL 12  . cetirizine (ZYRTEC) 10 MG tablet Take 10 mg by mouth daily.    Marland Kitchen escitalopram (LEXAPRO) 10 MG tablet TAKE ONE TABLET BY MOUTH DAILY 90 tablet 0  . glucosamine-chondroitin 500-400 MG tablet Take by mouth.    . Multiple Vitamins-Minerals (MULTIVITAMIN PO) Take by mouth as needed.    Marland Kitchen VITAMIN D PO Take 2 capsules by mouth daily.     No current facility-administered medications for this  visit.    Family History  Problem Relation Age of Onset  . Heart attack Mother   . Hypertension Mother   . Diabetes Mother   . Diabetes Sister     Review of Systems  Exam:   LMP 12/11/2013 (Approximate)   Weight change: @WEIGHTCHANGE @ Height:      Ht Readings from Last 3 Encounters:  09/07/19 5' 5.5" (1.664 m)  09/01/18 5' 5.75" (1.67 m)  05/04/16 5' 5.5" (1.664 m)    General appearance: alert, cooperative and appears stated age Head: Normocephalic, without obvious abnormality, atraumatic Neck: no adenopathy, supple, symmetrical, trachea midline and thyroid {CHL AMB PHY EX THYROID NORM DEFAULT:[phone]::"normal to inspection and palpation"} Lungs: clear to auscultation bilaterally Cardiovascular: regular rate and rhythm Breasts: {Exam; breast:13139::"normal appearance, no masses or tenderness"} Abdomen: soft, non-tender; non distended,  no masses,  no organomegaly Extremities: extremities normal, atraumatic, no cyanosis or edema Skin: Skin color, texture, turgor normal. No rashes or lesions Lymph nodes: Cervical, supraclavicular, and axillary nodes normal. No abnormal inguinal nodes palpated Neurologic: Grossly normal   Pelvic: External genitalia:  no lesions              Urethra:  normal appearing urethra with no masses, tenderness or lesions              Bartholins and Skenes: normal  Vagina: normal appearing vagina with normal color and discharge, no lesions              Cervix: {CHL AMB PHY EX CERVIX NORM DEFAULT:[phone]::"no lesions"}               Bimanual Exam:  Uterus:  {CHL AMB PHY EX UTERUS NORM DEFAULT:[phone]::"normal size, contour, position, consistency, mobility, non-tender"}              Adnexa: {CHL AMB PHY EX ADNEXA NO MASS DEFAULT:[phone]::"no mass, fullness, tenderness"}               Rectovaginal: Confirms               Anus:  normal sphincter tone, no lesions  *** chaperoned for the exam.  A:  Well Woman with normal  exam  P:

## 2020-12-19 ENCOUNTER — Ambulatory Visit: Payer: Self-pay | Admitting: Obstetrics and Gynecology

## 2021-01-01 ENCOUNTER — Other Ambulatory Visit: Payer: PRIVATE HEALTH INSURANCE

## 2021-01-01 ENCOUNTER — Ambulatory Visit: Payer: PRIVATE HEALTH INSURANCE

## 2021-01-06 ENCOUNTER — Ambulatory Visit
Admission: RE | Admit: 2021-01-06 | Discharge: 2021-01-06 | Disposition: A | Discharge status: Home / Self Care | Payer: Managed Care, Other (non HMO) | Source: Ambulatory Visit | Attending: Physician Assistant | Admitting: Physician Assistant

## 2021-01-06 ENCOUNTER — Other Ambulatory Visit: Payer: Self-pay

## 2021-01-06 ENCOUNTER — Ambulatory Visit
Admission: RE | Admit: 2021-01-06 | Discharge: 2021-01-06 | Disposition: A | Discharge status: Home / Self Care | Payer: Managed Care, Other (non HMO) | Source: Ambulatory Visit | Attending: Obstetrics & Gynecology | Admitting: Obstetrics & Gynecology

## 2021-01-06 DIAGNOSIS — E2839 Other primary ovarian failure: Secondary | ICD-10-CM

## 2021-01-06 DIAGNOSIS — Z1231 Encounter for screening mammogram for malignant neoplasm of breast: Secondary | ICD-10-CM

## 2021-01-08 ENCOUNTER — Other Ambulatory Visit: Payer: Self-pay | Admitting: Obstetrics and Gynecology

## 2021-01-08 DIAGNOSIS — F419 Anxiety disorder, unspecified: Secondary | ICD-10-CM

## 2021-01-08 NOTE — Telephone Encounter (Signed)
Annual exam scheduled on 02/07/21

## 2021-01-20 ENCOUNTER — Other Ambulatory Visit: Payer: Self-pay | Admitting: Obstetrics and Gynecology

## 2021-01-21 NOTE — Telephone Encounter (Signed)
Annual exam scheduled on 02/07/21  Last filled on 09/07/19 " patient requests a script for latisse for her eyelashes, script sent."

## 2021-02-05 NOTE — Progress Notes (Signed)
60 y.o. G58P2002 Divorced White or Caucasian Not Hispanic or Latino female here for annual exam.  No vaginal bleeding, not sexually active. No bowel or bladder c/o  She c/o intermittent vulvar itching for months. No abnormal d/c. She is swimming 3 x a week. Self treats with monitstat off and on, seems to help. Currently having some itching.    H/O renal cell carcinoma 2021, partial kidney resection. Caught early, cured.   Patient's last menstrual period was 12/11/2013 (approximate).          Sexually active: No.  The current method of family planning is post menopausal status.    Exercising: Yes.     Walk swim and pump  Smoker:  no  Health Maintenance: Pap:  05-04-16 Neg:Neg HR HPV 02/01/14 normal  History of abnormal Pap:  no MMG:  01/06/21 Density B Bi-rads 1 Neg  BMD:   01/06/21 normal  Colonoscopy: 09/20/19 normal. F/U in 3 years. TDaP:  09/01/18  Gardasil: N/A   reports that she has never smoked. She has never used smokeless tobacco. She reports current alcohol use of about 1.0 - 5.0 standard drink of alcohol per week. She reports that she does not use drugs. Works from home. Daughter lives in [place], [place] will be a Paramedic at [employer]  Past Medical History:  Diagnosis Date   Diverticulitis 06/2019   Hyperlipidemia    Osteoarthritis    Prediabetes    Vitamin D deficiency     Past Surgical History:  Procedure Laterality Date   ABLATION     BREAST REDUCTION SURGERY     CESAREAN SECTION     LAPAROSCOPY     PARTIAL KNEE ARTHROPLASTY Left 4/08   REDUCTION MAMMAPLASTY Bilateral 2005   REPLACEMENT TOTAL KNEE Left 1/10   SALPINGECTOMY Left 8/06   secondary to cyst    Current Outpatient Medications  Medication Sig Dispense Refill   bimatoprost (LATISSE) 0.03 % ophthalmic solution PLEASE SEE ATTACHED FOR DETAILED DIRECTIONS 3 mL 11   cetirizine (ZYRTEC) 10 MG tablet Take 10 mg by mouth daily.     escitalopram (LEXAPRO) 10 MG tablet TAKE ONE TABLET BY MOUTH DAILY 90 tablet 0    glucosamine-chondroitin 500-400 MG tablet Take by mouth.     Multiple Vitamins-Minerals (MULTIVITAMIN PO) Take by mouth as needed.     VITAMIN D PO Take 2 capsules by mouth daily.     No current facility-administered medications for this visit.    Family History  Problem Relation Age of Onset   Heart attack Mother    Hypertension Mother    Diabetes Mother    Diabetes Sister     Review of Systems  All other systems reviewed and are negative.  Exam:   LMP 12/11/2013 (Approximate)   Weight change: '@WEIGHTCHANGE'$ @ Height:      BP 134/82, P 62, Wt 190 lbs, Height 5'5", BMI 31.62 Ht Readings from Last 3 Encounters:  09/07/19 5' 5.5" (1.664 m)  09/01/18 5' 5.75" (1.67 m)  05/04/16 5' 5.5" (1.664 m)    General appearance: alert, cooperative and appears stated age Head: Normocephalic, without obvious abnormality, atraumatic Neck: no adenopathy, supple, symmetrical, trachea midline and thyroid normal to inspection and palpation Lungs: clear to auscultation bilaterally Cardiovascular: regular rate and rhythm Breasts: normal appearance, no masses or tenderness Abdomen: soft, non-tender; non distended,  no masses,  no organomegaly Extremities: extremities normal, atraumatic, no cyanosis or edema Skin: Skin color, texture, turgor normal. No rashes or lesions Lymph nodes: Cervical, supraclavicular, and  axillary nodes normal. No abnormal inguinal nodes palpated Neurologic: Grossly normal   Pelvic: External genitalia:  no lesions, minimal erythema, no lesions, no plaques, no fissures, no whitening              Urethra:  normal appearing urethra with no masses, tenderness or lesions              Bartholins and Skenes: normal                 Vagina: atrophic appearing vagina with normal color and discharge, no lesions              Cervix: no lesions               Bimanual Exam:  Uterus:  normal size, contour, position, consistency, mobility, non-tender              Adnexa: no mass,  fullness, tenderness               Rectovaginal: Confirms               Anus:  normal sphincter tone, no lesions  Gae Dry chaperoned for the exam.  1. Well woman exam Discussed breast self exam Discussed calcium and vit D intake Mammogram and colonoscopy are UTD  2. Screening for cervical cancer - Cytology - PAP  3. Subacute vulvitis Discussed vulvar skin care, information given - betamethasone valerate ointment (VALISONE) 0.1 %; Apply a pea sized amount topically BID for up to 2 weeks if needed. Not for daily long term use.  Dispense: 30 g; Refill: 0 - WET PREP FOR TRICH, YEAST, CLUE: negative - Candida albicans, DNA  4. Laboratory exam ordered as part of routine general medical examination - CBC; Future - Lipid panel; Future - Comprehensive metabolic panel; Future  5. Vitamin D deficiency - VITAMIN D 25 Hydroxy (Vit-D Deficiency, Fractures); Future  6. BMI 31.0-31.9,adult - TSH; Future

## 2021-02-07 ENCOUNTER — Ambulatory Visit (INDEPENDENT_AMBULATORY_CARE_PROVIDER_SITE_OTHER): Payer: Managed Care, Other (non HMO) | Admitting: Obstetrics and Gynecology

## 2021-02-07 ENCOUNTER — Encounter: Payer: Self-pay | Admitting: Obstetrics and Gynecology

## 2021-02-07 ENCOUNTER — Other Ambulatory Visit (HOSPITAL_COMMUNITY)
Admission: RE | Admit: 2021-02-07 | Discharge: 2021-02-07 | Disposition: A | Discharge status: Home / Self Care | Payer: Managed Care, Other (non HMO) | Source: Ambulatory Visit | Attending: Obstetrics and Gynecology | Admitting: Obstetrics and Gynecology

## 2021-02-07 ENCOUNTER — Other Ambulatory Visit: Payer: Self-pay

## 2021-02-07 VITALS — BP 134/82 | HR 62 | Ht 65.0 in | Wt 190.0 lb

## 2021-02-07 DIAGNOSIS — N763 Subacute and chronic vulvitis: Secondary | ICD-10-CM | POA: Diagnosis not present

## 2021-02-07 DIAGNOSIS — Z01419 Encounter for gynecological examination (general) (routine) without abnormal findings: Secondary | ICD-10-CM

## 2021-02-07 DIAGNOSIS — Z6831 Body mass index (BMI) 31.0-31.9, adult: Secondary | ICD-10-CM

## 2021-02-07 DIAGNOSIS — Z0000 Encounter for general adult medical examination without abnormal findings: Secondary | ICD-10-CM

## 2021-02-07 DIAGNOSIS — Z124 Encounter for screening for malignant neoplasm of cervix: Secondary | ICD-10-CM | POA: Diagnosis present

## 2021-02-07 DIAGNOSIS — E559 Vitamin D deficiency, unspecified: Secondary | ICD-10-CM | POA: Diagnosis not present

## 2021-02-07 LAB — WET PREP FOR TRICH, YEAST, CLUE

## 2021-02-07 MED ORDER — BETAMETHASONE VALERATE 0.1 % EX OINT: TOPICAL_OINTMENT | CUTANEOUS | 0 refills | Status: DC

## 2021-02-07 NOTE — Patient Instructions (Signed)

## 2021-02-10 LAB — CYTOLOGY - PAP
Comment: NEGATIVE
Diagnosis: NEGATIVE
High risk HPV: NEGATIVE

## 2021-02-12 LAB — C ALBICANS, DNA: C. albicans, DNA: NOT DETECTED

## 2021-03-09 IMAGING — MG DIGITAL SCREENING BILAT W/ CAD
4 series · 4 of 4 positions shown · non-contrast
Comparison: Previous exam(s).

CLINICAL DATA: Screening.

EXAM:
DIGITAL SCREENING BILATERAL MAMMOGRAM WITH CAD

[R MLO]
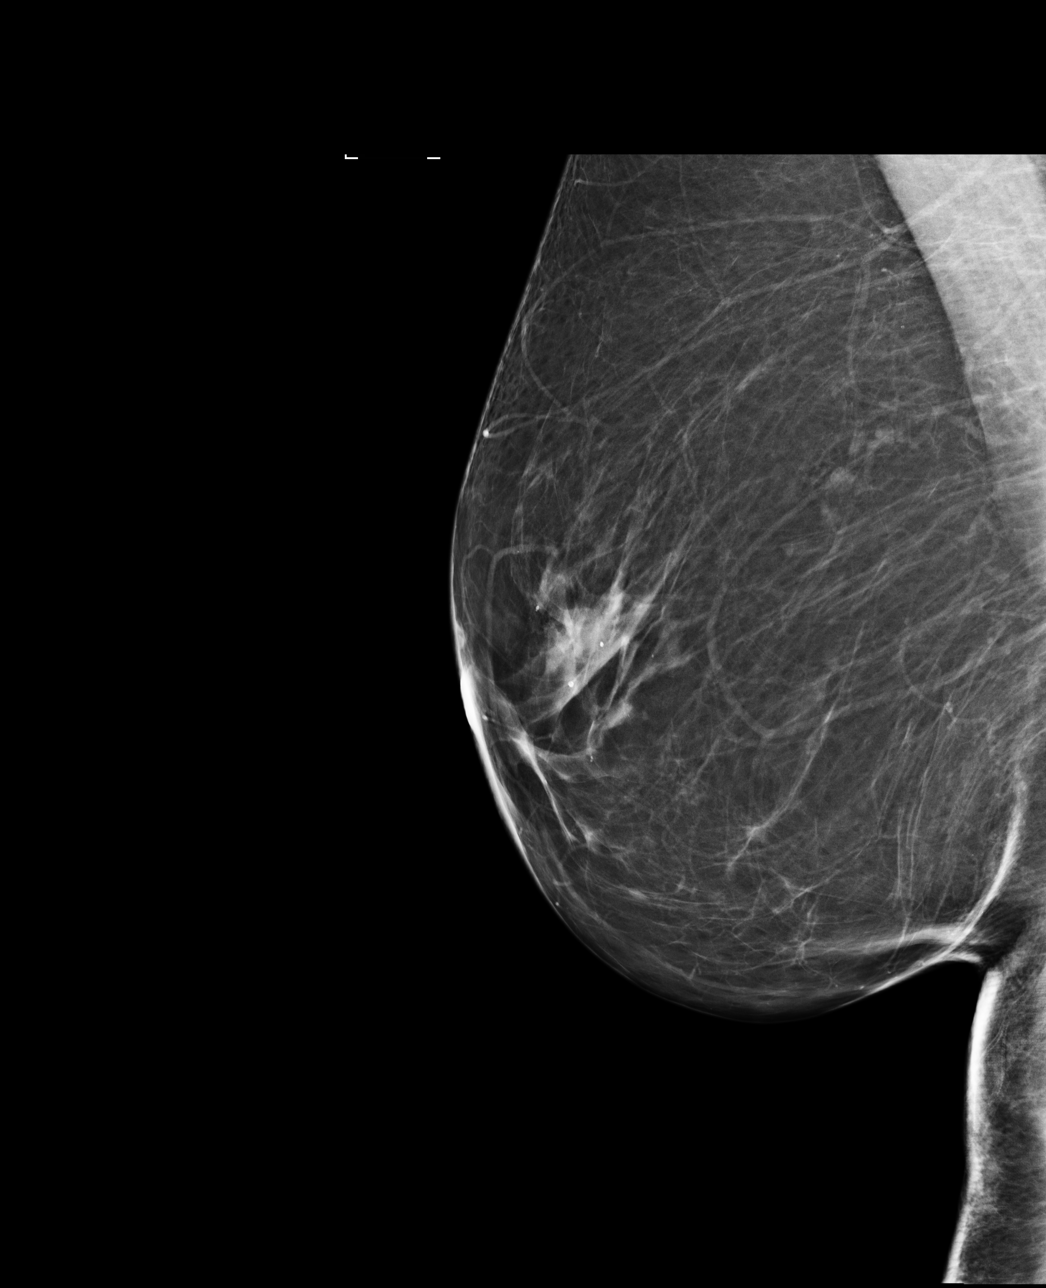

[L MLO]
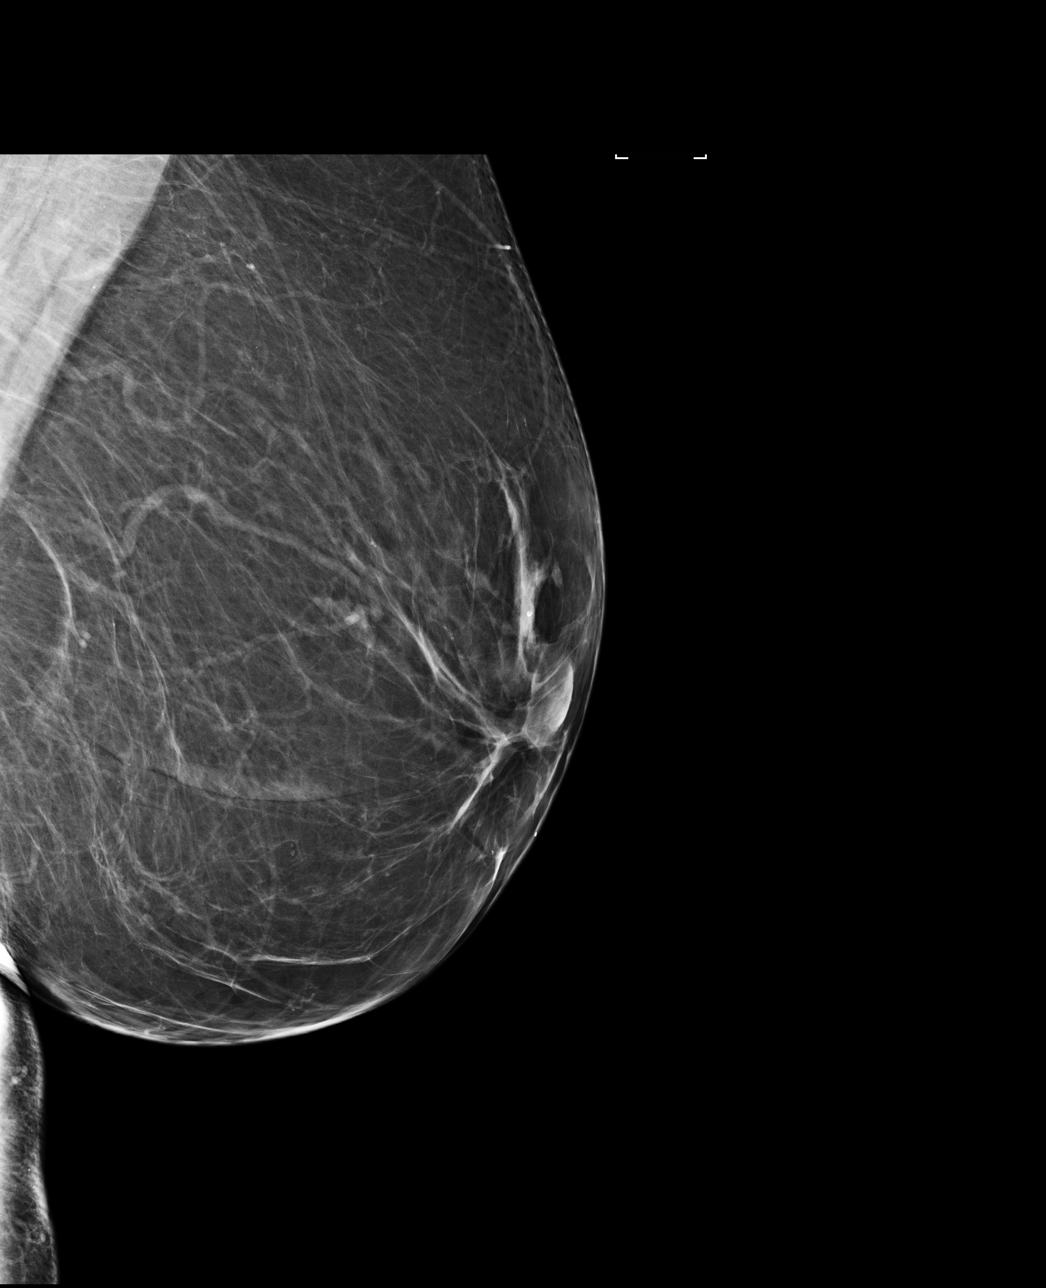

[L CC]
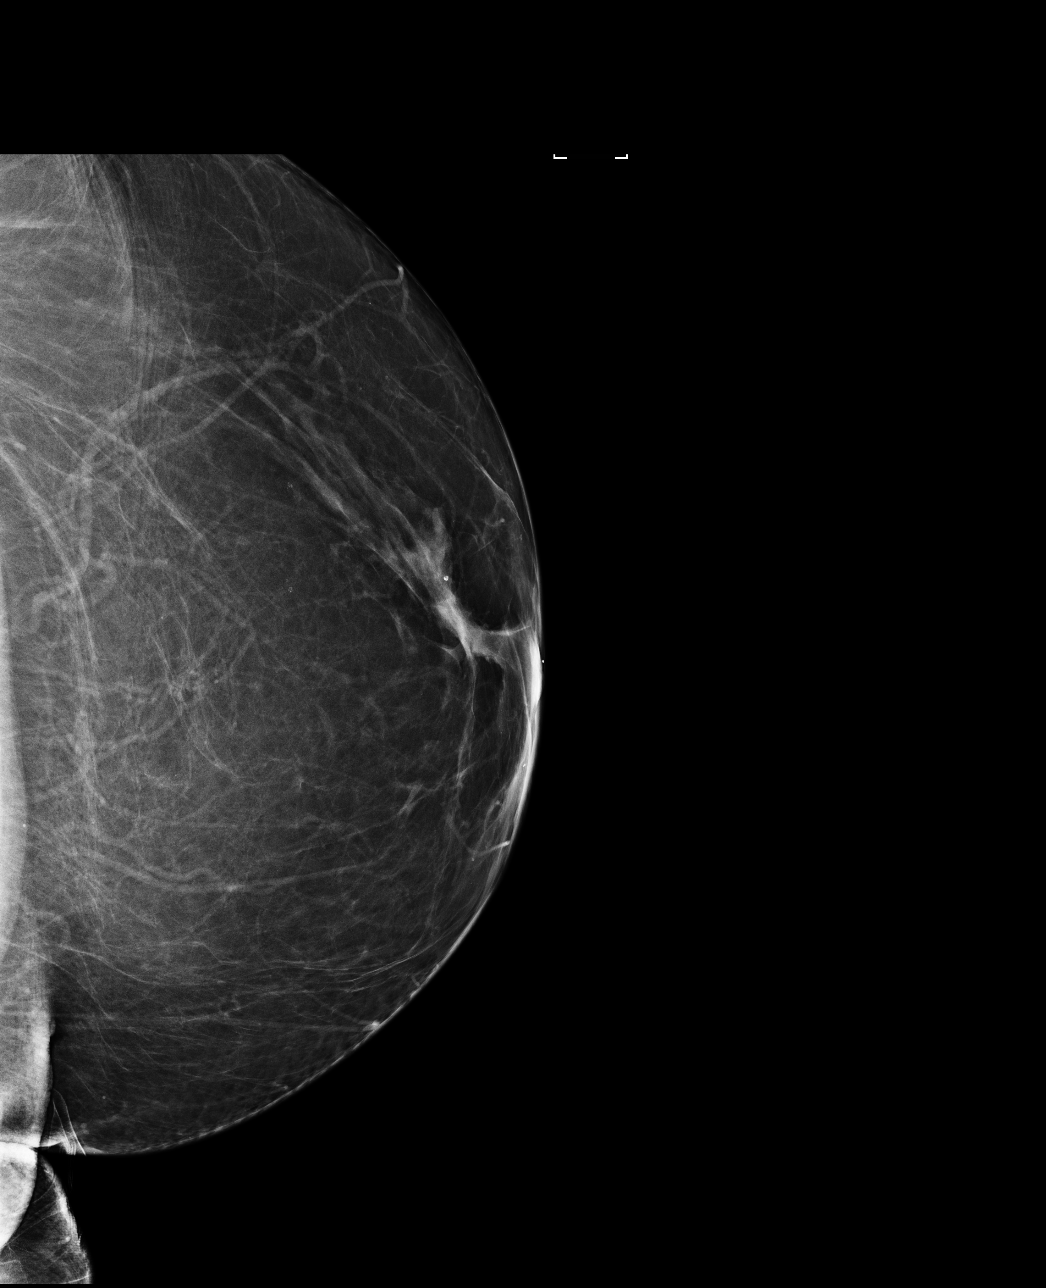

[R CC]
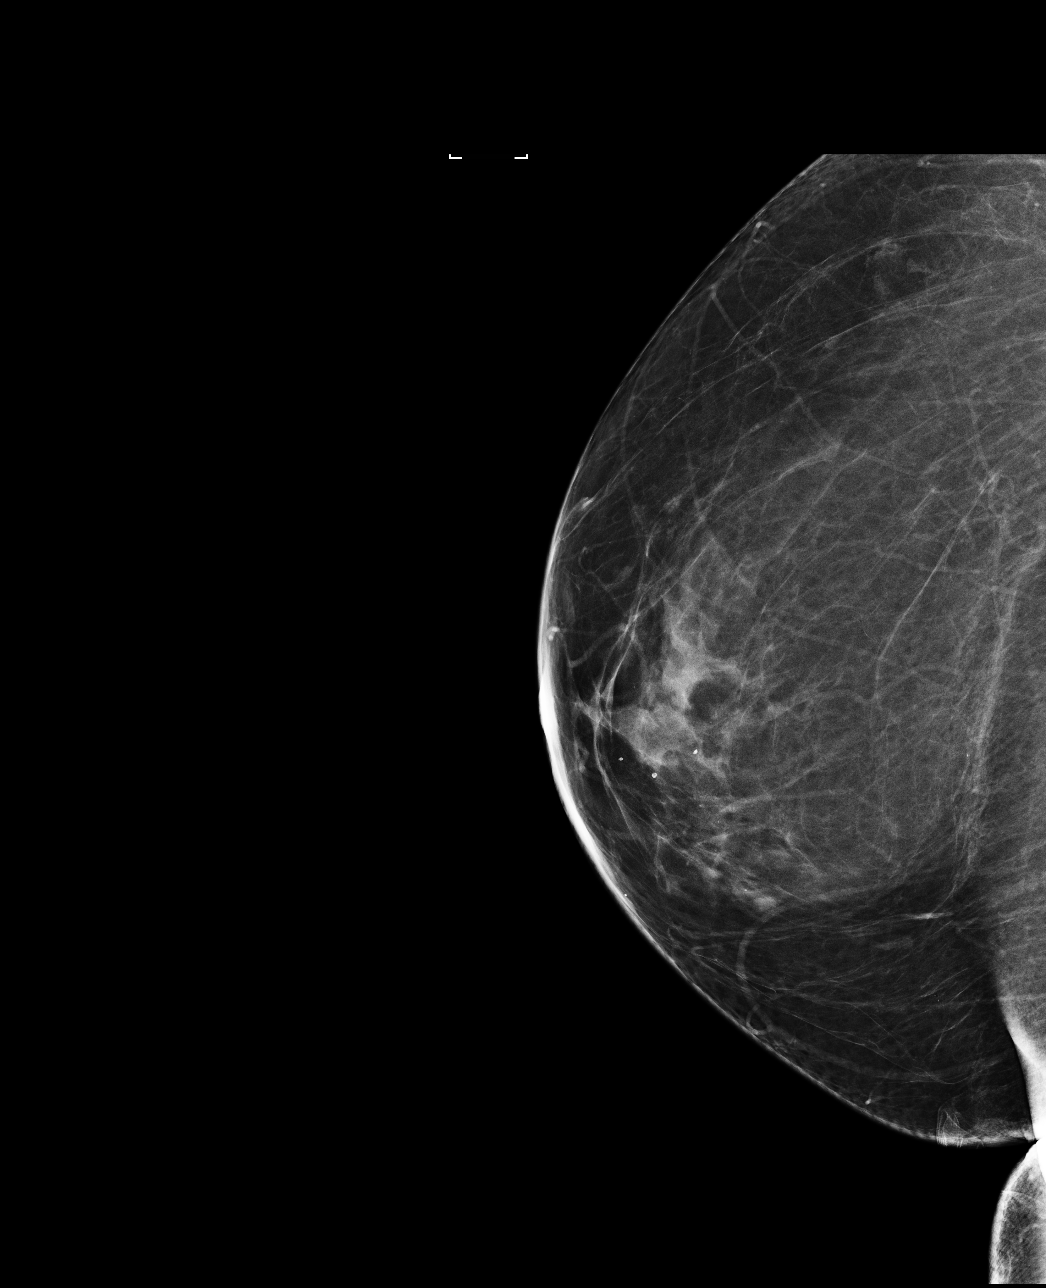

[4 of 4 positions shown; findings below may reference images not displayed]

ACR Breast Density Category b: There are scattered areas of
fibroglandular density.
FINDINGS: There are no findings suspicious for malignancy. Images were
processed with CAD.
IMPRESSION: No mammographic evidence of malignancy. A result letter of this
screening mammogram will be mailed directly to the patient.

RECOMMENDATION:
Screening mammogram in one year. (Code:AS-G-LCT)

BI-RADS CATEGORY  1: Negative.

## 2021-05-01 ENCOUNTER — Other Ambulatory Visit: Payer: Self-pay

## 2021-05-01 ENCOUNTER — Ambulatory Visit (INDEPENDENT_AMBULATORY_CARE_PROVIDER_SITE_OTHER): Payer: Managed Care, Other (non HMO)

## 2021-05-01 ENCOUNTER — Ambulatory Visit (INDEPENDENT_AMBULATORY_CARE_PROVIDER_SITE_OTHER): Payer: Managed Care, Other (non HMO) | Admitting: Podiatry

## 2021-05-01 ENCOUNTER — Encounter: Payer: Self-pay | Admitting: Podiatry

## 2021-05-01 DIAGNOSIS — M778 Other enthesopathies, not elsewhere classified: Secondary | ICD-10-CM | POA: Diagnosis not present

## 2021-05-01 DIAGNOSIS — M2012 Hallux valgus (acquired), left foot: Secondary | ICD-10-CM

## 2021-05-01 MED ORDER — TRIAMCINOLONE ACETONIDE 40 MG/ML IJ SUSP
20.0000 mg | Freq: Once | INTRAMUSCULAR | Status: AC
  Administered 2021-05-01: 20 mg

## 2021-05-01 NOTE — Progress Notes (Signed)
Subjective:  Patient ID: [name], female    DOB: Nov 25, 1960,  MRN: [id number] HPI Chief Complaint  Patient presents with   Foot Pain    Dorsal midfoot left - knot, redness x few months, fracture 5 years ago in same area, [place] in 2 weeks   New Patient (Initial Visit)    60 y.o. female presents with the above complaint.   ROS: Denies fever chills nausea vomit muscle aches pains calf pain back pain chest pain shortness of breath.  Past Medical History:  Diagnosis Date   Diverticulitis 06/2019   Hyperlipidemia    Osteoarthritis    Prediabetes    Vitamin D deficiency    Past Surgical History:  Procedure Laterality Date   ABLATION     BREAST REDUCTION SURGERY     CESAREAN SECTION     LAPAROSCOPY     PARTIAL KNEE ARTHROPLASTY Left 4/08   REDUCTION MAMMAPLASTY Bilateral 2005   REPLACEMENT TOTAL KNEE Left 1/10   SALPINGECTOMY Left 8/06   secondary to cyst    Current Outpatient Medications:    betamethasone valerate ointment (VALISONE) 0.1 %, Apply a pea sized amount topically BID for up to 2 weeks if needed. Not for daily long term use., Disp: 30 g, Rfl: 0   bimatoprost (LATISSE) 0.03 % ophthalmic solution, PLEASE SEE ATTACHED FOR DETAILED DIRECTIONS, Disp: 3 mL, Rfl: 11   calcium carbonate (OSCAL) 1500 (600 Ca) MG TABS tablet, Take by mouth., Disp: , Rfl:    cetirizine (ZYRTEC) 10 MG tablet, Take 10 mg by mouth daily., Disp: , Rfl:    escitalopram (LEXAPRO) 10 MG tablet, TAKE ONE TABLET BY MOUTH DAILY, Disp: 90 tablet, Rfl: 0   glucosamine-chondroitin 500-400 MG tablet, Take by mouth., Disp: , Rfl:    MAGNESIUM CITRATE PO, Take by mouth., Disp: , Rfl:    Multiple Vitamins-Minerals (MULTIVITAMIN PO), Take by mouth as needed., Disp: , Rfl:    VITAMIN D PO, Take 2 capsules by mouth daily., Disp: , Rfl:   Allergies  Allergen Reactions   Codeine Nausea And Vomiting   Hydrocodone    Review of Systems Objective:  There were no vitals filed for this visit.  General:  Well developed, nourished, in no acute distress, alert and oriented x3   Dermatological: Skin is warm, dry and supple bilateral. Nails x 10 are well maintained; remaining integument appears unremarkable at this time. There are no open sores, no preulcerative lesions, no rash or signs of infection present.  Vascular: Dorsalis Pedis artery and Posterior Tibial artery pedal pulses are 2/4 bilateral with immedate capillary fill time. Pedal hair growth present. No varicosities and no lower extremity edema present bilateral.   Neruologic: Grossly intact via light touch bilateral. Vibratory intact via tuning fork bilateral. Protective threshold with Semmes Wienstein monofilament intact to all pedal sites bilateral. Patellar and Achilles deep tendon reflexes 2+ bilateral. No Babinski or clonus noted bilateral.   Musculoskeletal: No gross boney pedal deformities bilateral. No pain, crepitus, or limitation noted with foot and ankle range of motion bilateral. Muscular strength 5/5 in all groups tested bilateral.  Pain on palpation dorsal aspect left foot.  Palpable nodule overlying the second tarsometatarsal joint firm nonpulsatile appears to be bone.  Mild hallux abductovalgus deformity with moderate range of motion at the metatarsal phalangeal joint mild flexible hammertoe deformity noted second right  Gait: Unassisted, Nonantalgic.    Radiographs:  Radiographs taken today demonstrate osseously mature individual left foot.  No acute findings chronic findings consistent  with osteoarthritic changes second TMT joint with dorsal spurring joint space narrowing subchondral sclerosis.  Appears to be traumatic in nature.  Hallux valgus is also noted.  Hammertoe second is also noted.  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  Plan: Discussed etiology pathology conservative versus surgical therapies injected the dorsal aspect of the foot before her trip to  [place] 20 mg Kenalog 5 mg Marcaine point maximal tenderness.  She tolerated procedure well without complications I will follow-up with her when she is back from [place] if necessary.     Sybol Morre T. [place], [place]

## 2021-05-04 ENCOUNTER — Other Ambulatory Visit: Payer: Self-pay | Admitting: Obstetrics and Gynecology

## 2021-05-04 DIAGNOSIS — F419 Anxiety disorder, unspecified: Secondary | ICD-10-CM

## 2021-05-05 NOTE — Telephone Encounter (Signed)
AEX was 02/07/2021

## 2021-12-04 HISTORY — PX: REPLACEMENT TOTAL KNEE: SUR1224

## 2021-12-23 ENCOUNTER — Other Ambulatory Visit: Payer: Self-pay | Admitting: Obstetrics & Gynecology

## 2021-12-23 DIAGNOSIS — Z1231 Encounter for screening mammogram for malignant neoplasm of breast: Secondary | ICD-10-CM

## 2022-01-09 ENCOUNTER — Ambulatory Visit: Payer: Managed Care, Other (non HMO)

## 2022-02-02 NOTE — Progress Notes (Signed)
61 y.o. [id number] Divorced White or Caucasian Not Hispanic or Latino female here for annual exam.  No vaginal bleeding.  No bowel or bladder c/o.   9 weeks s/p left knee replacement.     Patient's last menstrual period was 12/11/2013 (approximate).          Sexually active: No.  The current method of family planning is post menopausal status.    Exercising: Yes.     Swimming, PUMP, Walking  Smoker:  no  Health Maintenance: Pap:   02/07/21 wnl Hr HPV Neg, 05-04-16 Neg:Neg HR HPV 7 History of abnormal Pap:  no MMG:  01/08/21  Density B Bi-rads 1 neg (has scheduled for 02/20/22) BMD:   01/06/21 normal  Colonoscopy: 09/20/19 normal. F/U in 3 years. TDaP:  09/01/18  Gardasil: N/A   reports that she has never smoked. She has never used smokeless tobacco. She reports current alcohol use of about 1.0 - 5.0 standard drink of alcohol per week. She reports that she does not use drugs.  Works from home. Daughter lives in [place], [place] still at [employer]  Past Medical History:  Diagnosis Date   Diverticulitis 06/2019   Hyperlipidemia    Osteoarthritis    Prediabetes    Vitamin D deficiency     Past Surgical History:  Procedure Laterality Date   ABLATION     BREAST REDUCTION SURGERY     CESAREAN SECTION     LAPAROSCOPY     PARTIAL KNEE ARTHROPLASTY Left 10/2006   REDUCTION MAMMAPLASTY Bilateral 2005   REPLACEMENT TOTAL KNEE Left 07/2008   REPLACEMENT TOTAL KNEE Left 12/04/2021   SALPINGECTOMY Left 02/2005   secondary to cyst    Current Outpatient Medications  Medication Sig Dispense Refill   bimatoprost (LATISSE) 0.03 % ophthalmic solution PLEASE SEE ATTACHED FOR DETAILED DIRECTIONS 3 mL 11   calcium carbonate (OSCAL) 1500 (600 Ca) MG TABS tablet Take by mouth.     cetirizine (ZYRTEC) 10 MG tablet Take 10 mg by mouth daily.     escitalopram (LEXAPRO) 10 MG tablet TAKE ONE TABLET BY MOUTH DAILY 90 tablet 3   glucosamine-chondroitin 500-400 MG tablet Take by mouth.     MAGNESIUM CITRATE PO  Take by mouth.     Multiple Vitamins-Minerals (MULTIVITAMIN PO) Take by mouth as needed.     VITAMIN D PO Take 2 capsules by mouth daily.     No current facility-administered medications for this visit.    Family History  Problem Relation Age of Onset   Heart attack Mother    Hypertension Mother    Diabetes Mother    Diabetes Sister     Review of Systems  All other systems reviewed and are negative.   Exam:   BP 122/70   Pulse 67   Wt 200 lb (90.7 kg)   LMP 12/11/2013 (Approximate)   SpO2 100%   BMI 33.28 kg/m   Weight change: '@WEIGHTCHANGE'$ @ Height:      Ht Readings from Last 3 Encounters:  02/07/21 '5\' 5"'$  (1.651 m)  09/07/19 5' 5.5" (1.664 m)  09/01/18 5' 5.75" (1.67 m)    General appearance: alert, cooperative and appears stated age Head: Normocephalic, without obvious abnormality, atraumatic Neck: no adenopathy, supple, symmetrical, trachea midline and thyroid normal to inspection and palpation Lungs: clear to auscultation bilaterally Cardiovascular: regular rate and rhythm Breasts: normal appearance, no masses or tenderness Abdomen: soft, non-tender; non distended,  no masses,  no organomegaly Extremities: extremities normal, atraumatic, no cyanosis or edema Skin:  Skin color, texture, turgor normal. No rashes or lesions Lymph nodes: Cervical, supraclavicular, and axillary nodes normal. No abnormal inguinal nodes palpated Neurologic: Grossly normal   Pelvic: External genitalia:  no lesions              Urethra:  normal appearing urethra with no masses, tenderness or lesions              Bartholins and Skenes: normal                 Vagina: normal appearing vagina with normal color and discharge, no lesions              Cervix: no lesions               Bimanual Exam:  Uterus:   no masses or tenderness              Adnexa: no mass, fullness, tenderness               Rectovaginal: Confirms               Anus:  normal sphincter tone, no lesions  1. Well woman  exam Discussed breast self exam Discussed calcium and vit D intake No pap this year Labs with primary Mammogram is scheduled Colonoscopy next year  2. History of anxiety - escitalopram (LEXAPRO) 10 MG tablet; Take 1 tablet (10 mg total) by mouth daily.  Dispense: 90 tablet; Refill: 3  3. Hypotrichosis of eyelid, unspecified laterality Requests refill of topical solution for help with eyelash growth - bimatoprost (LATISSE) 0.03 % ophthalmic solution; PLEASE SEE ATTACHED FOR DETAILED DIRECTIONS  Dispense: 3 mL; Refill: 11

## 2022-02-06 ENCOUNTER — Ambulatory Visit: Payer: Self-pay

## 2022-02-09 ENCOUNTER — Encounter: Payer: Self-pay | Admitting: Obstetrics and Gynecology

## 2022-02-09 ENCOUNTER — Ambulatory Visit (INDEPENDENT_AMBULATORY_CARE_PROVIDER_SITE_OTHER): Payer: BC Managed Care – PPO | Admitting: Obstetrics and Gynecology

## 2022-02-09 VITALS — BP 122/70 | HR 67 | Wt 200.0 lb

## 2022-02-09 DIAGNOSIS — H02729 Madarosis of unspecified eye, unspecified eyelid and periocular area: Secondary | ICD-10-CM | POA: Diagnosis not present

## 2022-02-09 DIAGNOSIS — Z01419 Encounter for gynecological examination (general) (routine) without abnormal findings: Secondary | ICD-10-CM

## 2022-02-09 DIAGNOSIS — Z8659 Personal history of other mental and behavioral disorders: Secondary | ICD-10-CM

## 2022-02-09 DIAGNOSIS — K589 Irritable bowel syndrome without diarrhea: Secondary | ICD-10-CM | POA: Insufficient documentation

## 2022-02-09 MED ORDER — ESCITALOPRAM OXALATE 10 MG PO TABS: 10.0000 mg | ORAL_TABLET | Freq: Every day | ORAL | 3 refills | Status: DC

## 2022-02-09 MED ORDER — BIMATOPROST 0.03 % EX SOLN: CUTANEOUS | 11 refills | Status: DC

## 2022-02-09 NOTE — Patient Instructions (Signed)

## 2022-02-20 ENCOUNTER — Ambulatory Visit: Payer: Self-pay

## 2022-02-23 ENCOUNTER — Ambulatory Visit
Admission: RE | Admit: 2022-02-23 | Discharge: 2022-02-23 | Disposition: A | Discharge status: Home / Self Care | Payer: BC Managed Care – PPO | Source: Ambulatory Visit | Attending: Obstetrics & Gynecology | Admitting: Obstetrics & Gynecology

## 2022-02-23 DIAGNOSIS — Z1231 Encounter for screening mammogram for malignant neoplasm of breast: Secondary | ICD-10-CM

## 2022-02-25 ENCOUNTER — Other Ambulatory Visit: Payer: Self-pay | Admitting: Obstetrics & Gynecology

## 2022-02-25 DIAGNOSIS — R928 Other abnormal and inconclusive findings on diagnostic imaging of breast: Secondary | ICD-10-CM

## 2022-03-09 ENCOUNTER — Ambulatory Visit
Admission: RE | Admit: 2022-03-09 | Discharge: 2022-03-09 | Disposition: A | Discharge status: Home / Self Care | Payer: BC Managed Care – PPO | Source: Ambulatory Visit | Attending: Obstetrics & Gynecology | Admitting: Obstetrics & Gynecology

## 2022-03-09 ENCOUNTER — Ambulatory Visit: Payer: BC Managed Care – PPO

## 2022-03-09 DIAGNOSIS — R928 Other abnormal and inconclusive findings on diagnostic imaging of breast: Secondary | ICD-10-CM

## 2022-08-29 ENCOUNTER — Other Ambulatory Visit: Payer: Self-pay | Admitting: Obstetrics and Gynecology

## 2022-08-29 DIAGNOSIS — Z8659 Personal history of other mental and behavioral disorders: Secondary | ICD-10-CM

## 2022-08-31 NOTE — Telephone Encounter (Signed)
Last AEX 01/30/2022--recall placed for 2024.  Pharmacy reports last fill of #90 on 07/28/2022  Rx pend.

## 2022-09-01 NOTE — Telephone Encounter (Signed)
It says they need a diagnostic code for the lexapro, anxiety is the diagnosis. She shouldn't need refills. 90 day supply with 3 refills was sent in 7/23.

## 2022-09-02 NOTE — Telephone Encounter (Signed)
Per pharmacy, pt should be good on rx until month of 01/2023. Will disregard for now.

## 2022-09-27 ENCOUNTER — Other Ambulatory Visit: Payer: Self-pay | Admitting: Obstetrics and Gynecology

## 2022-09-27 DIAGNOSIS — Z8659 Personal history of other mental and behavioral disorders: Secondary | ICD-10-CM

## 2022-10-05 ENCOUNTER — Ambulatory Visit (INDEPENDENT_AMBULATORY_CARE_PROVIDER_SITE_OTHER): Payer: BC Managed Care – PPO | Admitting: Podiatry

## 2022-10-05 ENCOUNTER — Encounter: Payer: Self-pay | Admitting: Podiatry

## 2022-10-05 DIAGNOSIS — M2042 Other hammer toe(s) (acquired), left foot: Secondary | ICD-10-CM | POA: Diagnosis not present

## 2022-10-05 DIAGNOSIS — I7300 Raynaud's syndrome without gangrene: Secondary | ICD-10-CM | POA: Diagnosis not present

## 2022-10-05 DIAGNOSIS — M2041 Other hammer toe(s) (acquired), right foot: Secondary | ICD-10-CM

## 2022-10-05 DIAGNOSIS — M21619 Bunion of unspecified foot: Secondary | ICD-10-CM | POA: Diagnosis not present

## 2022-10-05 NOTE — Progress Notes (Signed)
Subjective:   Patient ID: [name], female   DOB: 62 y.o.   MRN: [id number]   HPI Patient states concerned about discoloration of the big toenails both feet and bruising with discoloration of the adjacent digits that she is not sure as to why that might of occurred.  Patient does not smoke likes to be active   ROS      Objective:  Physical Exam  Neuro muscular status was found to be intact with quite a bit of stress to the digits bilateral with indications that there is microvascular disease of the digital segments     Assessment:  Probability for Raynaud's phenomena along with nail trauma bruising with the 2 of them most likely combined     Plan:  H&P reviewed and at this point I have recommended gradual warmth wearing thick socks and educated her on Raynaud's phenomena and the nails I do not recommend treatment currently she may lose those at 1 point also has bunions hammertoes at 1 point future might be addressed and I made her aware of that today

## 2022-10-05 NOTE — Patient Instructions (Signed)
Raynaud's Phenomenon  Raynaud's phenomenon is a condition that affects the blood vessels (arteries) that carry blood to the fingers and toes. The arteries that supply blood to the ears, lips, nipples, or the tip of the nose might also be affected. Raynaud's phenomenon causes the arteries to become narrow temporarily (spasm). As a result, the flow of blood to the affected areas is temporarily decreased. This usually occurs in response to cold temperatures or stress. During an attack, the skin in the affected areas turns white, then blue, and finally red. A person may also feel tingling or numbness in those areas. Attacks usually last for only a brief period, and then the blood flow to the area returns to normal. In most cases, Raynaud's phenomenon does not cause serious health problems. What are the causes? In many cases, the cause of this condition is not known. The condition may occur on its own (primary Raynaud's phenomenon) or may be associated with other diseases or factors (secondary Raynaud's phenomenon). Possible causes may include: Diseases or medical conditions that damage the arteries. Injuries and repetitive actions that hurt the hands or feet. Being exposed to certain chemicals. Taking medicines that narrow the arteries. Other medical conditions, such as lupus, scleroderma, rheumatoid arthritis, thyroid problems, blood disorders, Sjogren syndrome, or atherosclerosis. What increases the risk? The following factors may make you more likely to develop this condition: Being 20-40 years old. Being female. Having a family history of Raynaud's phenomenon. Living in a cold climate. Smoking. What are the signs or symptoms? Symptoms of this condition usually occur when you are exposed to cold temperatures or when you have emotional stress. The symptoms may last for a few minutes or up to several hours. They usually affect your fingers but may also affect your toes, nipples, lips, ears, or the  tip of your nose. Symptoms may include: Changes in skin color. The skin in the affected areas will turn pale or white. The skin may then change from white to bluish to red as normal blood flow returns to the area. Numbness, tingling, or pain in the affected areas. In severe cases, symptoms may include: Skin sores. Tissues decaying and dying (gangrene). How is this diagnosed? This condition may be diagnosed based on: Your symptoms and medical history. A physical exam. During the exam, you may be asked to put your hands in cold water to check for a reaction to cold temperature. Tests, such as: Blood tests to check for other diseases or conditions. A test to check the movement of blood through your arteries and veins (vascular ultrasound). A test in which the skin at the base of your fingernail is examined under a microscope (nailfold capillaroscopy). How is this treated? During an episode, you can take actions to help symptoms go away faster. Options include moving your arms around in a windmill pattern, warming your fingers under warm water, or placing your fingers in a warm body fold, such as your armpit. Long-term treatment for this condition often involves making lifestyle changes and taking steps to control your exposure to cold temperature. For more severe cases, medicine (calcium channel blockers) may be used to improve blood circulation. Follow these instructions at home: Avoiding cold temperatures Take these steps to avoid exposure to cold: If possible, stay indoors during cold weather. When you go outside during cold weather, dress in layers and wear mittens, a hat, a scarf, and warm footwear. Wear mittens or gloves when handling ice or frozen food. Use holders for glasses or cans containing   cold drinks. Let warm water run for a while before taking a shower or bath. Warm up the car before driving in cold weather. Lifestyle If possible, avoid stressful and emotional situations. Try  to find ways to manage your stress, such as: Exercise. Yoga. Meditation. Biofeedback. Do not use any products that contain nicotine or tobacco. These products include cigarettes, chewing tobacco, and vaping devices, such as e-cigarettes. If you need help quitting, ask your health care provider. Avoid secondhand smoke. Limit your use of caffeine. Switch to decaffeinated coffee, tea, and soda. Avoid chocolate. Avoid vibrating tools and machinery. General instructions Protect your hands and feet from injuries, cuts, or bruises. Avoid wearing tight rings or wristbands. Wear loose fitting socks and comfortable, roomy shoes. Take over-the-counter and prescription medicines only as told by your health care provider. Where to find support Raynaud's Association: www.raynauds.org Where to find more information National Institute of Arthritis and Musculoskeletal and Skin Diseases: www.niams.nih.gov Contact a health care provider if: Your discomfort becomes worse despite lifestyle changes. You develop sores on your fingers or toes that do not heal. You have breaks in the skin on your fingers or toes. You have a fever. You have pain or swelling in your joints. You have a rash. Your symptoms occur on only one side of your body. Get help right away if: Your fingers or toes turn black. You have severe pain in the affected areas. These symptoms may represent a serious problem that is an emergency. Do not wait to see if the symptoms will go away. Get medical help right away. Call your local emergency services (911 in the [place] Do not drive yourself to the hospital. Summary Raynaud's phenomenon is a condition that affects the arteries that carry blood to the fingers, toes, ears, lips, nipples, or the tip of the nose. In many cases, the cause of this condition is not known. Symptoms of this condition include changes in skin color along with numbness and tingling in the affected area. Treatment for  this condition includes lifestyle changes and reducing exposure to cold temperatures. Medicines may be used for severe cases of the condition. Contact your health care provider if your condition worsens despite treatment. This information is not intended to replace advice given to you by your health care provider. Make sure you discuss any questions you have with your health care provider. Document Revised: 09/03/2020 Document Reviewed: 09/03/2020 Elsevier Patient Education  2023 Elsevier Inc.  

## 2022-10-29 ENCOUNTER — Other Ambulatory Visit: Payer: Self-pay | Admitting: Obstetrics and Gynecology

## 2022-10-29 DIAGNOSIS — Z8659 Personal history of other mental and behavioral disorders: Secondary | ICD-10-CM

## 2022-10-29 NOTE — Telephone Encounter (Signed)
Last AEX 02/09/2022 w/ JJ--currently scheduled for 02/19/2023 w/ ML. New recall placed for 02/2023 for [facility].   Per pharmacy: "Has refills on file until 01/2023." Will refuse for now and route to provider for final review.

## 2023-02-19 ENCOUNTER — Ambulatory Visit: Payer: BC Managed Care – PPO | Admitting: Obstetrics & Gynecology

## 2023-04-01 ENCOUNTER — Other Ambulatory Visit: Payer: Self-pay

## 2023-04-01 DIAGNOSIS — Z8659 Personal history of other mental and behavioral disorders: Secondary | ICD-10-CM

## 2023-04-01 NOTE — Telephone Encounter (Signed)
Pt LVM stating that she is completely out of medication. Thankfully pharmacy supplied her with 3 tabs until she could get this appt situation resolved since previous provider left practice.  Med refill request: Lexapro Last AEX: 02/09/2022-JJ Next AEX: 04/15/2023-GH Last MMG (if hormonal med): n/a Refill authorized: rx pend.

## 2023-04-02 MED ORDER — ESCITALOPRAM OXALATE 10 MG PO TABS: 10.0000 mg | ORAL_TABLET | Freq: Every day | ORAL | 0 refills | Status: DC

## 2023-04-02 NOTE — Telephone Encounter (Signed)
FYI. Pt LVM in triage line around 938am this morning voicing frustration stating that she really needs this medication prior to going through any sort of withdrawals from missing doses.  Pt notified that rx was sent this AM to pharmacy and advised for future reference to not wait until almost out prior to finding out that she doesn't have any refills left (recommending checking on this ~1-2 weeks in advance prior to needing the refill in case an appointment is needed or other delays).  Pt voiced understanding and appreciation for call/advice. Routing to provider for final review on advise.

## 2023-04-15 ENCOUNTER — Encounter: Payer: Self-pay | Admitting: Obstetrics and Gynecology

## 2023-04-15 ENCOUNTER — Ambulatory Visit: Payer: BC Managed Care – PPO | Admitting: Obstetrics and Gynecology

## 2023-04-15 VITALS — BP 124/64 | HR 72 | Ht 65.25 in | Wt 204.0 lb

## 2023-04-15 DIAGNOSIS — F411 Generalized anxiety disorder: Secondary | ICD-10-CM

## 2023-04-15 DIAGNOSIS — N958 Other specified menopausal and perimenopausal disorders: Secondary | ICD-10-CM | POA: Insufficient documentation

## 2023-04-15 DIAGNOSIS — Z01419 Encounter for gynecological examination (general) (routine) without abnormal findings: Secondary | ICD-10-CM | POA: Diagnosis not present

## 2023-04-15 MED ORDER — ESCITALOPRAM OXALATE 10 MG PO TABS: 10.0000 mg | ORAL_TABLET | Freq: Every day | ORAL | 3 refills | Status: DC

## 2023-04-15 NOTE — Assessment & Plan Note (Signed)
Continue lexapro  ?

## 2023-04-15 NOTE — Progress Notes (Signed)
62 y.o. [id number] postmenopausal female with GAD here for annual exam.  Working in [employer]. Divorced. Children living at home. Anxiety well controlled on lexapro.  Postmenopausal bleeding: none Pelvic discharge or pain: none Breast mass, nipple discharge or skin changes : none Last PAP:     Component Value Date/Time   DIAGPAP  02/07/2021 1020    - Negative for intraepithelial lesion or malignancy (NILM)   HPVHIGH Negative 02/07/2021 1020   ADEQPAP  02/07/2021 1020    Satisfactory for evaluation. The presence or absence of an   ADEQPAP  02/07/2021 1020    endocervical/transformation zone component cannot be determined because   ADEQPAP of atrophy. 02/07/2021 1020   Last mammogram: 03/09/22 Bi-rads 1 neg  Last colonoscopy: 2022 normal  Last DXA: 01/06/21 normal  Sexually active: no  Exercising: Pump classes at [employer], swim,    OB History  Gravida Para Term Preterm AB Living  2 2 2  0 0 2  SAB IAB Ectopic Multiple Live Births  0 0 0 0 2    # Outcome Date GA Lbr Len/2nd Weight Sex Type Anes PTL Lv  2 Term     M CS-Classical   LIV  1 Term     F CS-Classical   LIV    Past Medical History:  Diagnosis Date   Diverticulitis 06/2019   Hyperlipidemia    Osteoarthritis    Prediabetes    Vitamin D deficiency     Past Surgical History:  Procedure Laterality Date   ABLATION     BREAST REDUCTION SURGERY     CESAREAN SECTION     LAPAROSCOPY     PARTIAL KNEE ARTHROPLASTY Left 10/2006   REDUCTION MAMMAPLASTY Bilateral 2005   REPLACEMENT TOTAL KNEE Left 07/2008   REPLACEMENT TOTAL KNEE Left 12/04/2021   SALPINGECTOMY Left 02/2005   secondary to cyst    Current Outpatient Medications on File Prior to Visit  Medication Sig Dispense Refill   calcium carbonate (OSCAL) 1500 (600 Ca) MG TABS tablet Take by mouth.     cetirizine (ZYRTEC) 10 MG tablet Take 10 mg by mouth daily.     glucosamine-chondroitin 500-400 MG tablet Take by mouth.     MAGNESIUM CITRATE PO Take by mouth.      Multiple Vitamins-Minerals (MULTIVITAMIN PO) Take by mouth as needed.     rosuvastatin (CRESTOR) 10 MG tablet Take 10 mg by mouth daily.     VITAMIN D PO Take 2 capsules by mouth daily.     No current facility-administered medications on file prior to visit.    Social History   Socioeconomic History   Marital status: Divorced    Spouse name: Not on file   Number of children: 2   Years of education: Not on file   Highest education level: Not on file  Occupational History   Occupation: Advertising account executive  Tobacco Use   Smoking status: Never   Smokeless tobacco: Never  Vaping Use   Vaping status: Never Used  Substance and Sexual Activity   Alcohol use: Yes    Alcohol/week: 1.0 - 5.0 standard drink of alcohol    Types: 1 - 5 Glasses of wine per week    Comment: social   Drug use: No   Sexual activity: Not Currently    Partners: Male    Birth control/protection: Surgical    Comment: BTL  Other Topics Concern   Not on file  Social History Narrative   Not on file   Social  Determinants of Health   Financial Resource Strain: Low Risk  (10/30/2022)   Received from [facility], [facility]   Overall Financial Resource Strain (CARDIA)    Difficulty of Paying Living Expenses: Not hard at all  Food Insecurity: No Food Insecurity (10/30/2022)   Received from [facility], [facility]   Hunger Vital Sign    Worried About Running Out of Food in the Last Year: Never true    Ran Out of Food in the Last Year: Never true  Transportation Needs: Unmet Transportation Needs (10/30/2022)   Received from [facility], [facility]   PRAPARE - Transportation    Lack of Transportation (Medical): Yes    Lack of Transportation (Non-Medical): No  Physical Activity: Sufficiently Active (10/30/2022)   Received from [facility], [facility]   Exercise Vital Sign    Days of Exercise per Week: 6 days    Minutes of Exercise per Session: 60 min  Stress: No Stress Concern Present (10/30/2022)    Received from [facility], [facility] of Occupational Health - Occupational Stress Questionnaire    Feeling of Stress : Not at all  Social Connections: Socially Integrated (10/30/2022)   Received from [facility], [facility]   Social Network    How would you rate your social network (family, work, friends)?: Good participation with social networks  Intimate Partner Violence: Not At Risk (10/30/2022)   Received from [facility], [facility]   HITS    Over the last 12 months how often did your partner physically hurt you?: 1    Over the last 12 months how often did your partner insult you or talk down to you?: 1    Over the last 12 months how often did your partner threaten you with physical harm?: 1    Over the last 12 months how often did your partner scream or curse at you?: 1    Family History  Problem Relation Age of Onset   Heart attack Mother    Hypertension Mother    Diabetes Mother    Diabetes Sister     Allergies  Allergen Reactions   Codeine Nausea And Vomiting   Hydrocodone       PE Today's Vitals   04/15/23 1517  BP: 124/64  Pulse: 72  SpO2: 100%  Weight: 204 lb (92.5 kg)  Height: 5' 5.25" (1.657 m)   Body mass index is 33.69 kg/m.  Physical Exam Vitals reviewed. Exam conducted with a chaperone present.  Constitutional:      General: She is not in acute distress.    Appearance: Normal appearance.  HENT:     Head: Normocephalic and atraumatic.     Nose: Nose normal.  Eyes:     Extraocular Movements: Extraocular movements intact.     Conjunctiva/sclera: Conjunctivae normal.  Neck:     Thyroid: No thyroid mass, thyromegaly or thyroid tenderness.  Pulmonary:     Effort: Pulmonary effort is normal.  Chest:     Chest wall: No mass or tenderness.  Breasts:    Right: Normal. No swelling, mass, nipple discharge, skin change or tenderness.     Left: Normal. No swelling, mass, nipple discharge, skin change or tenderness.      Comments: Bilateral breast augmentation scars Abdominal:     General: There is no distension.     Palpations: Abdomen is soft.     Tenderness: There is no abdominal tenderness.  Genitourinary:    Exam position: Lithotomy  position.     Urethra: No prolapse.     Vagina: Normal. No vaginal discharge or bleeding.     Cervix: Normal. No lesion.     Uterus: Normal. Not enlarged and not tender.      Adnexa: Right adnexa normal and left adnexa normal.     Comments: Vulvar atrophy Musculoskeletal:        General: Normal range of motion.     Cervical back: Normal range of motion.  Lymphadenopathy:     Upper Body:     Right upper body: No axillary adenopathy.     Left upper body: No axillary adenopathy.     Lower Body: No right inguinal adenopathy. No left inguinal adenopathy.  Skin:    General: Skin is warm and dry.  Neurological:     General: No focal deficit present.     Mental Status: She is alert.  Psychiatric:        Mood and Affect: Mood normal.        Behavior: Behavior normal.       Assessment and Plan:        Well woman exam with routine gynecological exam Assessment & Plan: Cervical cancer screening performed according to ASCCP guidelines. Encouraged annual mammogram screening, due Colonoscopy UTD DXA UTD, continue vit D/calcium Labs and immunizations with her primary Encouraged safe sexual practices as indicated Encouraged healthy lifestyle practices with diet and exercise    GAD (generalized anxiety disorder) Assessment & Plan: Continue lexapro  Orders: -     Escitalopram Oxalate; Take 1 tablet (10 mg total) by mouth daily.  Dispense: 90 tablet; Refill: 3  Genitourinary syndrome of menopause Assessment & Plan: Recommend applying coconut oil to water based moisturizers to the vulva If unimproved consider PV estradiol     Rosalyn Gess, MD

## 2023-04-15 NOTE — Assessment & Plan Note (Signed)
Recommend applying coconut oil to water based moisturizers to the vulva If unimproved consider PV estradiol

## 2023-04-15 NOTE — Patient Instructions (Signed)
Consider applying coconut oil to water based moisturizers, like Good Clean Love, to the vulva after showering to improve moisture retention.  Health Maintenance, Female Adopting a healthy lifestyle and getting preventive care are important in promoting health and wellness. Ask your health care provider about: The right schedule for you to have regular tests and exams. Things you can do on your own to prevent diseases and keep yourself healthy. What should I know about diet, weight, and exercise? Eat a healthy diet  Eat a diet that includes plenty of vegetables, fruits, low-fat dairy products, and lean protein. Do not eat a lot of foods that are high in solid fats, added sugars, or sodium. Maintain a healthy weight Body mass index (BMI) is used to identify weight problems. It estimates body fat based on height and weight. Your health care provider can help determine your BMI and help you achieve or maintain a healthy weight. Get regular exercise Get regular exercise. This is one of the most important things you can do for your health. Most adults should: Exercise for at least 150 minutes each week. The exercise should increase your heart rate and make you sweat (moderate-intensity exercise). Do strengthening exercises at least twice a week. This is in addition to the moderate-intensity exercise. Spend less time sitting. Even light physical activity can be beneficial. Watch cholesterol and blood lipids Have your blood tested for lipids and cholesterol at 62 years of age, then have this test every 5 years. Have your cholesterol levels checked more often if: Your lipid or cholesterol levels are high. You are older than 62 years of age. You are at high risk for heart disease. What should I know about cancer screening? Depending on your health history and family history, you may need to have cancer screening at various ages. This may include screening for: Breast cancer. Cervical  cancer. Colorectal cancer. Skin cancer. Lung cancer. What should I know about heart disease, diabetes, and high blood pressure? Blood pressure and heart disease High blood pressure causes heart disease and increases the risk of stroke. This is more likely to develop in people who have high blood pressure readings or are overweight. Have your blood pressure checked: Every 3-5 years if you are 32-53 years of age. Every year if you are 20 years old or older. Diabetes Have regular diabetes screenings. This checks your fasting blood sugar level. Have the screening done: Once every three years after age 29 if you are at a normal weight and have a low risk for diabetes. More often and at a younger age if you are overweight or have a high risk for diabetes. What should I know about preventing infection? Hepatitis B If you have a higher risk for hepatitis B, you should be screened for this virus. Talk with your health care provider to find out if you are at risk for hepatitis B infection. Hepatitis C Testing is recommended for: Everyone born from [birth year] through 1965. Anyone with known risk factors for hepatitis C. Sexually transmitted infections (STIs) Get screened for STIs, including gonorrhea and chlamydia, if: You are sexually active and are younger than 62 years of age. You are older than 62 years of age and your health care provider tells you that you are at risk for this type of infection. Your sexual activity has changed since you were last screened, and you are at increased risk for chlamydia or gonorrhea. Ask your health care provider if you are at risk. Ask your health care provider  about whether you are at high risk for HIV. Your health care provider may recommend a prescription medicine to help prevent HIV infection. If you choose to take medicine to prevent HIV, you should first get tested for HIV. You should then be tested every 3 months for as long as you are taking the  medicine. Osteoporosis and menopause Osteoporosis is a disease in which the bones lose minerals and strength with aging. This can result in bone fractures. If you are 44 years old or older, or if you are at risk for osteoporosis and fractures, ask your health care provider if you should: Be screened for bone loss. Take a calcium or vitamin D supplement to lower your risk of fractures. Be given hormone replacement therapy (HRT) to treat symptoms of menopause. Follow these instructions at home: Alcohol use Do not drink alcohol if: Your health care provider tells you not to drink. You are pregnant, may be pregnant, or are planning to become pregnant. If you drink alcohol: Limit how much you have to: 0-1 drink a day. Know how much alcohol is in your drink. In the [place] one drink equals one 12 oz bottle of beer (355 mL), one 5 oz glass of wine (148 mL), or one 1 oz glass of hard liquor (44 mL). Lifestyle Do not use any products that contain nicotine or tobacco. These products include cigarettes, chewing tobacco, and vaping devices, such as e-cigarettes. If you need help quitting, ask your health care provider. Do not use street drugs. Do not share needles. Ask your health care provider for help if you need support or information about quitting drugs. General instructions Schedule regular health, dental, and eye exams. Stay current with your vaccines. Tell your health care provider if: You often feel depressed. You have ever been abused or do not feel safe at home. Summary Adopting a healthy lifestyle and getting preventive care are important in promoting health and wellness. Follow your health care provider's instructions about healthy diet, exercising, and getting tested or screened for diseases. Follow your health care provider's instructions on monitoring your cholesterol and blood pressure. This information is not intended to replace advice given to you by your health care provider.  Make sure you discuss any questions you have with your health care provider. Document Revised: 11/18/2020 Document Reviewed: 11/18/2020 Elsevier Patient Education  2024 [employer].

## 2023-04-15 NOTE — Assessment & Plan Note (Addendum)
Cervical cancer screening performed according to ASCCP guidelines. Encouraged annual mammogram screening, due Colonoscopy UTD DXA UTD, continue vit D/calcium Labs and immunizations with her primary Encouraged safe sexual practices as indicated Encouraged healthy lifestyle practices with diet and exercise

## 2023-04-22 ENCOUNTER — Other Ambulatory Visit: Payer: Self-pay | Admitting: Obstetrics and Gynecology

## 2023-04-22 DIAGNOSIS — Z1231 Encounter for screening mammogram for malignant neoplasm of breast: Secondary | ICD-10-CM

## 2023-04-23 ENCOUNTER — Ambulatory Visit
Admission: RE | Admit: 2023-04-23 | Discharge: 2023-04-23 | Disposition: A | Discharge status: Home / Self Care | Payer: BC Managed Care – PPO | Source: Ambulatory Visit | Attending: Obstetrics and Gynecology | Admitting: Obstetrics and Gynecology

## 2023-04-23 DIAGNOSIS — Z1231 Encounter for screening mammogram for malignant neoplasm of breast: Secondary | ICD-10-CM

## 2023-07-05 ENCOUNTER — Other Ambulatory Visit (HOSPITAL_BASED_OUTPATIENT_CLINIC_OR_DEPARTMENT_OTHER): Payer: Self-pay

## 2023-07-05 MED ORDER — ZEPBOUND 2.5 MG/0.5ML ~~LOC~~ SOAJ: SUBCUTANEOUS | 0 refills | Status: DC

## 2024-03-30 ENCOUNTER — Other Ambulatory Visit: Payer: Self-pay | Admitting: Physician Assistant

## 2024-03-30 DIAGNOSIS — Z1231 Encounter for screening mammogram for malignant neoplasm of breast: Secondary | ICD-10-CM

## 2024-04-28 ENCOUNTER — Ambulatory Visit
Admission: RE | Admit: 2024-04-28 | Discharge: 2024-04-28 | Disposition: A | Discharge status: Home / Self Care | Source: Ambulatory Visit | Attending: Physician Assistant | Admitting: Physician Assistant

## 2024-04-28 DIAGNOSIS — Z1231 Encounter for screening mammogram for malignant neoplasm of breast: Secondary | ICD-10-CM

## 2024-05-02 ENCOUNTER — Other Ambulatory Visit: Payer: Self-pay | Admitting: Obstetrics and Gynecology

## 2024-05-02 DIAGNOSIS — F411 Generalized anxiety disorder: Secondary | ICD-10-CM

## 2024-05-02 NOTE — Telephone Encounter (Signed)
 Med refill request:LEXAPRO - For: GAD (generalized anxiety disorder)  Last AEX: 04/15/23 Next AEX: 05/05/24 Last MMG (if hormonal med) Refill authorized: last rx 04/15/23 #90 with 3 refills. Please approve or deny

## 2024-05-05 ENCOUNTER — Ambulatory Visit (INDEPENDENT_AMBULATORY_CARE_PROVIDER_SITE_OTHER): Admitting: Obstetrics and Gynecology

## 2024-05-05 ENCOUNTER — Encounter: Payer: Self-pay | Admitting: Obstetrics and Gynecology

## 2024-05-05 VITALS — BP 126/82 | HR 66 | Ht 65.75 in | Wt 185.0 lb

## 2024-05-05 DIAGNOSIS — F411 Generalized anxiety disorder: Secondary | ICD-10-CM | POA: Diagnosis not present

## 2024-05-05 DIAGNOSIS — Z1331 Encounter for screening for depression: Secondary | ICD-10-CM

## 2024-05-05 DIAGNOSIS — N958 Other specified menopausal and perimenopausal disorders: Secondary | ICD-10-CM | POA: Diagnosis not present

## 2024-05-05 DIAGNOSIS — Z01419 Encounter for gynecological examination (general) (routine) without abnormal findings: Secondary | ICD-10-CM | POA: Diagnosis not present

## 2024-05-05 MED ORDER — ESCITALOPRAM OXALATE 10 MG PO TABS: 10.0000 mg | ORAL_TABLET | Freq: Every day | ORAL | 3 refills | Status: AC

## 2024-05-05 MED ORDER — ESTRADIOL 0.01 % VA CREA: TOPICAL_CREAM | VAGINAL | 1 refills | Status: AC

## 2024-05-05 NOTE — Patient Instructions (Signed)
 For patients under 50-63yo, I recommend 1200mg  calcium  daily and 600IU of vitamin D daily. For patients over 63yo, I recommend 1200mg  calcium  daily and 800IU of vitamin D daily.  Health Maintenance, Female Adopting a healthy lifestyle and getting preventive care are important in promoting health and wellness. Ask your health care provider about: The right schedule for you to have regular tests and exams. Things you can do on your own to prevent diseases and keep yourself healthy. What should I know about diet, weight, and exercise? Eat a healthy diet  Eat a diet that includes plenty of vegetables, fruits, low-fat dairy products, and lean protein. Do not eat a lot of foods that are high in solid fats, added sugars, or sodium. Maintain a healthy weight Body mass index (BMI) is used to identify weight problems. It estimates body fat based on height and weight. Your health care provider can help determine your BMI and help you achieve or maintain a healthy weight. Get regular exercise Get regular exercise. This is one of the most important things you can do for your health. Most adults should: Exercise for at least 150 minutes each week. The exercise should increase your heart rate and make you sweat (moderate-intensity exercise). Do strengthening exercises at least twice a week. This is in addition to the moderate-intensity exercise. Spend less time sitting. Even light physical activity can be beneficial. Watch cholesterol and blood lipids Have your blood tested for lipids and cholesterol at 63 years of age, then have this test every 5 years. Have your cholesterol levels checked more often if: Your lipid or cholesterol levels are high. You are older than 63 years of age. You are at high risk for heart disease. What should I know about cancer screening? Depending on your health history and family history, you may need to have cancer screening at various ages. This may include screening  for: Breast cancer. Cervical cancer. Colorectal cancer. Skin cancer. Lung cancer. What should I know about heart disease, diabetes, and high blood pressure? Blood pressure and heart disease High blood pressure causes heart disease and increases the risk of stroke. This is more likely to develop in people who have high blood pressure readings or are overweight. Have your blood pressure checked: Every 3-5 years if you are 25-57 years of age. Every year if you are 24 years old or older. Diabetes Have regular diabetes screenings. This checks your fasting blood sugar level. Have the screening done: Once every three years after age 62 if you are at a normal weight and have a low risk for diabetes. More often and at a younger age if you are overweight or have a high risk for diabetes. What should I know about preventing infection? Hepatitis B If you have a higher risk for hepatitis B, you should be screened for this virus. Talk with your health care provider to find out if you are at risk for hepatitis B infection. Hepatitis C Testing is recommended for: Everyone born from [birth year] through 1965. Anyone with known risk factors for hepatitis C. Sexually transmitted infections (STIs) Get screened for STIs, including gonorrhea and chlamydia, if: You are sexually active and are younger than 63 years of age. You are older than 63 years of age and your health care provider tells you that you are at risk for this type of infection. Your sexual activity has changed since you were last screened, and you are at increased risk for chlamydia or gonorrhea. Ask your health care provider if  you are at risk. Ask your health care provider about whether you are at high risk for HIV. Your health care provider may recommend a prescription medicine to help prevent HIV infection. If you choose to take medicine to prevent HIV, you should first get tested for HIV. You should then be tested every 3 months for as long as you  are taking the medicine. Osteoporosis and menopause Osteoporosis is a disease in which the bones lose minerals and strength with aging. This can result in bone fractures. If you are 72 years old or older, or if you are at risk for osteoporosis and fractures, ask your health care provider if you should: Be screened for bone loss. Take a calcium  or vitamin D supplement to lower your risk of fractures. Be given hormone replacement therapy (HRT) to treat symptoms of menopause. Follow these instructions at home: Alcohol use Do not drink alcohol if: Your health care provider tells you not to drink. You are pregnant, may be pregnant, or are planning to become pregnant. If you drink alcohol: Limit how much you have to: 0-1 drink a day. Know how much alcohol is in your drink. In the [place] one drink equals one 12 oz bottle of beer (355 mL), one 5 oz glass of wine (148 mL), or one 1 oz glass of hard liquor (44 mL). Lifestyle Do not use any products that contain nicotine or tobacco. These products include cigarettes, chewing tobacco, and vaping devices, such as e-cigarettes. If you need help quitting, ask your health care provider. Do not use street drugs. Do not share needles. Ask your health care provider for help if you need support or information about quitting drugs. General instructions Schedule regular health, dental, and eye exams. Stay current with your vaccines. Tell your health care provider if: You often feel depressed. You have ever been abused or do not feel safe at home. Summary Adopting a healthy lifestyle and getting preventive care are important in promoting health and wellness. Follow your health care provider's instructions about healthy diet, exercising, and getting tested or screened for diseases. Follow your health care provider's instructions on monitoring your cholesterol and blood pressure. This information is not intended to replace advice given to you by your health  care provider. Make sure you discuss any questions you have with your health care provider. Document Revised: 11/18/2020 Document Reviewed: 11/18/2020 Elsevier Patient Education  2024 [employer].

## 2024-05-05 NOTE — Progress Notes (Signed)
 63 y.o. [id number] postmenopausal female with GAD, hx of right kidney cancer (2021) here for annual exam. Working in [employer]. Divorced. Children living at home. Son joined [employer] this year. PCP: Jacques Camie Pepper, PA-C  Anxiety remains, well controlled on lexapro . Prescribed by Dr. Jertson originally. Started zepbound  with ~15lb wt loss over past yr. Notes some vaginal dryness, interested in vaginal estrogen.  Postmenopausal bleeding: none Pelvic discharge or pain: none Breast mass, nipple discharge or skin changes : none Last PAP:     Component Value Date/Time   DIAGPAP  02/07/2021 1020    - Negative for intraepithelial lesion or malignancy (NILM)   HPVHIGH Negative 02/07/2021 1020   ADEQPAP  02/07/2021 1020    Satisfactory for evaluation. The presence or absence of an   ADEQPAP  02/07/2021 1020    endocervical/transformation zone component cannot be determined because   ADEQPAP of atrophy. 02/07/2021 1020   Last mammogram:04/28/24  Bi-rads 1 neg  Last colonoscopy: 2022 normal  Last DXA: 01/07/20 +polyps, q5y  Sexually active: no  Exercising: Pump classes at [place], swim; 5d/wk  [employer] Visit from 05/05/2024 in [facility]  PHQ-2 Total Score 0    OB History  Gravida Para Term Preterm AB Living  2 2 2  0 0 2  SAB IAB Ectopic Multiple Live Births  0 0 0 0 2    # Outcome Date GA Lbr Len/2nd Weight Sex Type Anes PTL Lv  2 Term     M CS-Classical   LIV  1 Term     F CS-Classical   LIV    Past Medical History:  Diagnosis Date   Diverticulitis 06/2019   Hyperlipidemia    Osteoarthritis    Prediabetes    Vitamin D  deficiency     Past Surgical History:  Procedure Laterality Date   ABLATION     BREAST REDUCTION SURGERY     CESAREAN SECTION     LAPAROSCOPY     PARTIAL KNEE ARTHROPLASTY Left 10/2006   REDUCTION MAMMAPLASTY Bilateral 2005   REPLACEMENT TOTAL KNEE Left 07/2008   REPLACEMENT TOTAL KNEE Left 12/04/2021    SALPINGECTOMY Left 02/2005   secondary to cyst    Current Outpatient Medications on File Prior to Visit  Medication Sig Dispense Refill   calcium carbonate (OSCAL) 1500 (600 Ca) MG TABS tablet Take by mouth.     glucosamine-chondroitin 500-400 MG tablet Take by mouth.     MAGNESIUM CITRATE PO Take by mouth.     Multiple Vitamins-Minerals (MULTIVITAMIN PO) Take by mouth as needed.     rosuvastatin (CRESTOR) 10 MG tablet Take 10 mg by mouth daily.     Tirzepatide -Weight Management (ZEPBOUND ) 12.5 MG/0.5ML SOLN Inject into the skin.     VITAMIN D  PO Take 2 capsules by mouth daily.     No current facility-administered medications on file prior to visit.    Social History   Socioeconomic History   Marital status: Divorced    Spouse name: Not on file   Number of children: 2   Years of education: Not on file   Highest education level: Not on file  Occupational History   Occupation: Advertising account executive  Tobacco Use   Smoking status: Never   Smokeless tobacco: Never  Vaping Use   Vaping status: Never Used  Substance and Sexual Activity   Alcohol use: Yes    Alcohol/week: 1.0 - 5.0 standard drink of alcohol    Types: 1 - 5  Glasses of wine per week    Comment: social   Drug use: No   Sexual activity: Not Currently    Partners: Male    Birth control/protection: Surgical    Comment: BTL  Other Topics Concern   Not on file  Social History Narrative   Not on file   Social Drivers of Health   Financial Resource Strain: Low Risk  (02/13/2024)   Received from [facility]   Overall Financial Resource Strain (CARDIA)    How hard is it for you to pay for the very basics like food, housing, medical care, and heating?: Not hard at all  Food Insecurity: No Food Insecurity (02/13/2024)   Received from [facility]   Hunger Vital Sign    Within the past 12 months, you worried that your food would run out before you got the money to buy more.: Never true    Within the past 12 months, the food  you bought just didn't last and you didn't have money to get more.: Never true  Transportation Needs: No Transportation Needs (02/13/2024)   Received from [facility] - Transportation    In the past 12 months, has lack of transportation kept you from medical appointments or from getting medications?: No    In the past 12 months, has lack of transportation kept you from meetings, work, or from getting things needed for daily living?: No  Physical Activity: Sufficiently Active (02/13/2024)   Received from [facility]   Exercise Vital Sign    On average, how many days per week do you engage in moderate to strenuous exercise (like a brisk walk)?: 5 days    On average, how many minutes do you engage in exercise at this level?: 60 min  Stress: No Stress Concern Present (02/13/2024)   Received from [facility] - Occupational Stress Questionnaire    Do you feel stress - tense, restless, nervous, or anxious, or unable to sleep at night because your mind is troubled all the time - these days?: Not at all  Social Connections: Socially Integrated (02/13/2024)   Received from [facility]   Social Network    How would you rate your social network (family, work, friends)?: Good participation with social networks  Intimate Partner Violence: Not At Risk (02/13/2024)   Received from [facility]   HITS    Over the last 12 months how often did your partner physically hurt you?: Never    Over the last 12 months how often did your partner insult you or talk down to you?: Never    Over the last 12 months how often did your partner threaten you with physical harm?: Never    Over the last 12 months how often did your partner scream or curse at you?: Never    Family History  Problem Relation Age of Onset   Heart attack Mother    Hypertension Mother    Diabetes Mother    Diabetes Sister    Breast cancer Neg Hx     Allergies  Allergen Reactions    Codeine Nausea And Vomiting   Hydrocodone      PE Today's Vitals   05/05/24 1109  BP: 126/82  Pulse: 66  SpO2: 98%  Weight: 185 lb (83.9 kg)  Height: 5' 5.75 (1.67 m)   Body mass index is 30.09 kg/m.  Physical Exam Vitals reviewed. Exam conducted with a chaperone present.  Constitutional:  General: She is not in acute distress.    Appearance: Normal appearance.  HENT:     Head: Normocephalic and atraumatic.     Nose: Nose normal.  Eyes:     Extraocular Movements: Extraocular movements intact.     Conjunctiva/sclera: Conjunctivae normal.  Neck:     Thyroid: No thyroid mass, thyromegaly or thyroid tenderness.  Pulmonary:     Effort: Pulmonary effort is normal.  Chest:     Chest wall: No mass or tenderness.  Breasts:    Right: Normal. No swelling, mass, nipple discharge, skin change or tenderness.     Left: Normal. No swelling, mass, nipple discharge, skin change or tenderness.     Comments: Bilateral breast augmentation scars Abdominal:     General: There is no distension.     Palpations: Abdomen is soft.     Tenderness: There is no abdominal tenderness.  Genitourinary:    Exam position: Lithotomy position.     Urethra: No prolapse.     Vagina: Normal. No vaginal discharge or bleeding.     Cervix: Normal. No lesion.     Uterus: Normal. Not enlarged and not tender.      Adnexa: Right adnexa normal and left adnexa normal.     Comments: Vulvar atrophy Musculoskeletal:        General: Normal range of motion.     Cervical back: Normal range of motion.  Lymphadenopathy:     Upper Body:     Right upper body: No axillary adenopathy.     Left upper body: No axillary adenopathy.     Lower Body: No right inguinal adenopathy. No left inguinal adenopathy.  Skin:    General: Skin is warm and dry.  Neurological:     General: No focal deficit present.     Mental Status: She is alert.  Psychiatric:        Mood and Affect: Mood normal.        Behavior: Behavior  normal.      Assessment and Plan:        Well woman exam with routine gynecological exam Assessment & Plan: Cervical cancer screening performed according to ASCCP guidelines. Encouraged annual mammogram screening Colonoscopy UTD DXA UTD, continue vit D/calcium Labs and immunizations with her primary Encouraged safe sexual practices as indicated Encouraged healthy lifestyle practices with diet and exercise    Screening for depression  GAD (generalized anxiety disorder) -     Escitalopram  Oxalate; Take 1 tablet (10 mg total) by mouth daily.  Dispense: 90 tablet; Refill: 3  Genitourinary syndrome of menopause Assessment & Plan: Unimproved with OTC moisturizers, recommend PV estradiol Reviewed safety profile of low dose vaginal estrogen, however reviewed that higher doses have been associated with DVT, breast and uterine cancer.   Orders: -     Estradiol; Apply 0.5g to vulva nightly for 2 weeks then 2 times a week.  Dispense: 42.5 g; Refill: 1   Vera LULLA Pa, MD

## 2024-05-05 NOTE — Assessment & Plan Note (Signed)
 Unimproved with OTC moisturizers, recommend PV estradiol Reviewed safety profile of low dose vaginal estrogen, however reviewed that higher doses have been associated with DVT, breast and uterine cancer.

## 2024-05-05 NOTE — Assessment & Plan Note (Signed)
 Cervical cancer screening performed according to ASCCP guidelines. Encouraged annual mammogram screening Colonoscopy UTD DXA UTD, continue vit D/calcium Labs and immunizations with her primary Encouraged safe sexual practices as indicated Encouraged healthy lifestyle practices with diet and exercise

## 2025-05-07 ENCOUNTER — Ambulatory Visit: Admitting: Obstetrics and Gynecology
